# Patient Record
Sex: Male | Born: 1970 | Race: Black or African American | Hispanic: No | Marital: Married | Smoking: Current every day smoker
Health system: Southern US, Community
[De-identification: ages and names within clinical notes are randomized; demographics above are authoritative.]

## PROBLEM LIST (undated history)

## (undated) DIAGNOSIS — D869 Sarcoidosis, unspecified: Secondary | ICD-10-CM

---

## 2011-11-02 ENCOUNTER — Emergency Department (HOSPITAL_COMMUNITY)
Admission: EM | Admit: 2011-11-02 | Discharge: 2011-11-02 | Disposition: A | Discharge status: Home / Self Care | Payer: No Typology Code available for payment source | Attending: Emergency Medicine | Admitting: Emergency Medicine

## 2011-11-02 ENCOUNTER — Encounter: Payer: Self-pay | Admitting: Emergency Medicine

## 2011-11-02 DIAGNOSIS — M545 Low back pain, unspecified: Secondary | ICD-10-CM | POA: Insufficient documentation

## 2011-11-02 DIAGNOSIS — Y92410 Unspecified street and highway as the place of occurrence of the external cause: Secondary | ICD-10-CM | POA: Insufficient documentation

## 2011-11-02 DIAGNOSIS — T148XXA Other injury of unspecified body region, initial encounter: Secondary | ICD-10-CM | POA: Insufficient documentation

## 2011-11-02 MED ORDER — IBUPROFEN 800 MG PO TABS: 800.0000 mg | ORAL_TABLET | Freq: Three times a day (TID) | ORAL | Status: AC | PRN

## 2011-11-02 MED ORDER — METHOCARBAMOL 500 MG PO TABS: 1000.0000 mg | ORAL_TABLET | Freq: Four times a day (QID) | ORAL | Status: AC
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
## 2011-11-02 NOTE — ED Notes (Signed)
Left lower back pain and neck pain. 5/10. No loss of bowek or bladder function.  Movement makes pain worse. Not taking anything for pain
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
## 2011-11-03 NOTE — ED Provider Notes (Signed)
Medical screening examination/treatment/procedure(s) were performed by non-physician practitioner and as supervising physician I was immediately available for consultation/collaboration.  Ellenor Wisniewski, MD 11/03/11 0039 

## 2012-05-09 ENCOUNTER — Emergency Department (HOSPITAL_COMMUNITY): Payer: Self-pay

## 2012-05-09 ENCOUNTER — Emergency Department (HOSPITAL_COMMUNITY)
Admission: EM | Admit: 2012-05-09 | Discharge: 2012-05-09 | Disposition: A | Discharge status: Home / Self Care | Payer: Self-pay | Attending: Emergency Medicine | Admitting: Emergency Medicine

## 2012-05-09 ENCOUNTER — Encounter (HOSPITAL_COMMUNITY): Payer: Self-pay

## 2012-05-09 DIAGNOSIS — R109 Unspecified abdominal pain: Secondary | ICD-10-CM | POA: Insufficient documentation

## 2012-05-09 DIAGNOSIS — R10819 Abdominal tenderness, unspecified site: Secondary | ICD-10-CM | POA: Insufficient documentation

## 2012-05-09 DIAGNOSIS — R112 Nausea with vomiting, unspecified: Secondary | ICD-10-CM | POA: Insufficient documentation

## 2012-05-09 LAB — DIFFERENTIAL
Basophils Relative: 1 % (ref 0–1)
Eosinophils Absolute: 0.2 10*3/uL (ref 0.0–0.7)
Eosinophils Relative: 3 % (ref 0–5)
Lymphs Abs: 1.5 10*3/uL (ref 0.7–4.0)
Monocytes Absolute: 0.7 10*3/uL (ref 0.1–1.0)
Monocytes Relative: 8 % (ref 3–12)
Neutrophils Relative %: 71 % (ref 43–77)

## 2012-05-09 LAB — COMPREHENSIVE METABOLIC PANEL
Albumin: 3 g/dL — ABNORMAL LOW (ref 3.5–5.2)
Alkaline Phosphatase: 58 U/L (ref 39–117)
BUN: 6 mg/dL (ref 6–23)
Calcium: 8.3 mg/dL — ABNORMAL LOW (ref 8.4–10.5)
Creatinine, Ser: 0.75 mg/dL (ref 0.50–1.35)
GFR calc Af Amer: >90 mL/min (ref >90)
Glucose, Bld: 93 mg/dL (ref 70–99)
Potassium: 3.5 mEq/L (ref 3.5–5.1)
Total Protein: 5.4 g/dL — ABNORMAL LOW (ref 6.0–8.3)

## 2012-05-09 LAB — URINALYSIS, ROUTINE W REFLEX MICROSCOPIC
Bilirubin Urine: NEGATIVE
Leukocytes, UA: NEGATIVE
Nitrite: NEGATIVE
Specific Gravity, Urine: 1.022 (ref 1.005–1.030)
Urobilinogen, UA: 1 mg/dL (ref 0.0–1.0)
pH: 6.5 (ref 5.0–8.0)

## 2012-05-09 LAB — CBC
MCH: 33 pg (ref 26.0–34.0)
MCHC: 33.8 g/dL (ref 30.0–36.0)
WBC: 8.4 10*3/uL (ref 4.0–10.5)

## 2012-05-09 LAB — LIPASE, BLOOD: Lipase: 37 U/L (ref 11–59)

## 2012-05-09 MED ORDER — ONDANSETRON HCL 4 MG PO TABS: 4.0000 mg | ORAL_TABLET | Freq: Four times a day (QID) | ORAL | Status: AC

## 2012-05-09 MED ORDER — MORPHINE SULFATE 4 MG/ML IJ SOLN
4.0000 mg | Freq: Once | INTRAMUSCULAR | Status: AC
  Administered 2012-05-09: 4 mg via INTRAVENOUS
  Filled 2012-05-09: qty 1

## 2012-05-09 MED ORDER — DICYCLOMINE HCL 20 MG PO TABS: 20.0000 mg | ORAL_TABLET | Freq: Two times a day (BID) | ORAL | Status: DC

## 2012-05-09 MED ORDER — SODIUM CHLORIDE 0.9 % IV BOLUS (SEPSIS)
1000.0000 mL | Freq: Once | INTRAVENOUS | Status: AC
  Administered 2012-05-09: 1000 mL via INTRAVENOUS

## 2012-05-09 MED ORDER — ONDANSETRON HCL 4 MG/2ML IJ SOLN
4.0000 mg | Freq: Once | INTRAMUSCULAR | Status: AC
  Administered 2012-05-09: 4 mg via INTRAVENOUS
  Filled 2012-05-09: qty 2

## 2012-05-09 NOTE — ED Provider Notes (Signed)
History     CSN: [id number]  Arrival date & time 05/09/12  1140   First MD Initiated Contact with Patient 05/09/12 1214      Chief Complaint  Patient presents with  . Abdominal Pain    (Consider location/radiation/quality/duration/timing/severity/associated sxs/prior treatment) HPI History from patient. 41 year old male who presents with 3-4 days of crampy abdominal pain. States that he experiences a crampy sensation to the left side of his abdomen. It has not significantly changed in character since it began. It does not radiate. No known aggravating/alleviating factors. He did have one episode of emesis last evening. No change in bowel movements. He denies fever or chills. Normal appetite. Has taken Tylenol which was somewhat helpful. No history of abdominal surgery.  History reviewed. No pertinent past medical history.  History reviewed. No pertinent past surgical history.  History reviewed. No pertinent family history.  History  Substance Use Topics  . Smoking status: Current Everyday Smoker  . Smokeless tobacco: Never Used  . Alcohol Use: Yes      Review of Systems  Constitutional: Negative for fever, chills, activity change and appetite change.  Respiratory: Negative for cough, chest tightness and shortness of breath.   Cardiovascular: Negative for chest pain and palpitations.  Gastrointestinal: Positive for nausea, vomiting and abdominal pain. Negative for diarrhea, constipation, blood in stool, abdominal distention, anal bleeding and rectal pain.  Neurological: Negative for dizziness and weakness.  All other systems reviewed and are negative.    Allergies  Review of patient's allergies indicates no known allergies.  Home Medications  No current outpatient prescriptions on file.  BP 109/71  Pulse 68  Temp(Src) 97.6 F (36.4 C) (Oral)  Resp 18  SpO2 100%  Physical Exam  Nursing note and vitals reviewed. Constitutional: He appears well-developed and  well-nourished. No distress.  HENT:  Head: Normocephalic and atraumatic.  Eyes: EOM are normal.  Neck: Normal range of motion.  Cardiovascular: Normal rate, regular rhythm and normal heart sounds.   Pulmonary/Chest: Effort normal and breath sounds normal.  Abdominal: Soft. Bowel sounds are normal.       L abd mildly tender to palpation, no rebound or guard. No epigastric or R sided pain.  Musculoskeletal: Normal range of motion.  Neurological: He is alert.  Skin: Skin is warm and dry. He is not diaphoretic.  Psychiatric: He has a normal mood and affect.    ED Course  Procedures (including critical care time)  Labs Reviewed  CBC - Abnormal; Notable for the following:    RBC 3.97 (*)    HCT 38.8 (*)    All other components within normal limits  COMPREHENSIVE METABOLIC PANEL - Abnormal; Notable for the following:    Calcium 8.3 (*)    Total Protein 5.4 (*)    Albumin 3.0 (*)    All other components within normal limits  DIFFERENTIAL  LIPASE, BLOOD  URINALYSIS, ROUTINE W REFLEX MICROSCOPIC   Dg Abd Acute W/chest  05/09/2012  *RADIOLOGY REPORT*  Clinical Data: Left side abdominal pain for 4 days, constipation, vomiting, history sarcoidosis, smoking  ACUTE ABDOMEN SERIES (ABDOMEN 2 VIEW & CHEST 1 VIEW)  Comparison: None  Findings: Normal heart size, mediastinal contours, and pulmonary vascularity. Calcified bilateral hilar and minimal mediastinal adenopathy consistent with history of sarcoidosis. Bibasilar atelectasis versus scarring greater on the right. No acute infiltrate, pleural effusion or pneumothorax. Normal bowel gas pattern. No bowel dilatation, bowel wall thickening, or free intraperitoneal air. No urinary tract calcification or acute abdominal findings.  IMPRESSION: Calcified granulomatous disease consistent with history of sarcoidosis. Bibasilar atelectasis versus scarring, greater on the right. No acute abdominal findings.  Original Report Authenticated By: Lollie Marrow,  M.D.     1. Abdominal pain       MDM  Pt presents with 3-4 days of crampy abd pain, without known aggravating/alleviating factors. Very mildly tender to L side of abd. Afebrile with nl vitals. Labs, acute abd series unremarkable. Pt feeling better with medication here, able to eat food while here without worsening pain/n/v. Based on this, will tx symptomatically. Discussed findings and plan with pt, as well as reasons to return to ED. Discussed serial abd exams for worsening pain. Pt and wife verbalized understanding, were agreeable with plan.        Grant Fontana, [place] 05/09/12 [phone]

## 2012-05-09 NOTE — Discharge Instructions (Signed)
Your labs were normal appearing today and did not show signs of problems with your electrolytes, infection, or other worrisome findings. You have been given a prescription for a medication to help with the crampiness as well as a medication to help with the nausea. Please take these as prescribed. Also consider adding a stool softener such as Senokot or similar, which is over the counter. If your condition worsens, you develop high fever, worsening vomiting, or any other worrisome symptoms, please return to the ED immediately.  RESOURCE GUIDE  Dental Problems  Patients with Medicaid: [facility] [phone]                                           [id number] Phone:  [phone]                                                  Phone:  [phone]  If unable to pay or uninsured, contact:  [facility] or [facility]. to become qualified for the adult dental clinic.  Chronic Pain Problems Contact [facility]  [phone] Patients need to be referred by their primary care doctor.  Insufficient Money for Medicine Contact [employer]:  call "211" or [facility] [phone].  No Primary Care Doctor Call [facility]  [phone] Other agencies that provide inexpensive medical care    [facility]  [phone]    [facility]  [phone]    [facility]  [phone]    [facility]  [phone]    [facility]  [phone]    [facility]  [phone]  Psychological Services [facility]  [phone] [facility]  [phone] [facility]   [phone] (emergency services ([phone])  Substance Abuse Resources [facility]  [phone] [facility] (386) [phone] [facility] [phone] Floydene Flock [phone] Residential & Outpatient Substance Abuse Program   [phone]  Abuse/Neglect [facility] Child Abuse Hotline (609)093-1295 [facility] Hotline (226) 634-8247 (After Hours)  Emergency Shelter [facility] [phone]  M[facility] [place] [facility] (989)003-7906 [facility] (737)[phone]  MRSA Hotline #:   747-556-5001    [facility] Resources  [facility] [place]     [employer]                          [facility] [address] [place]                       [address]      [address]  Patrecia Pace  Michell Heinrich Phone:  [phone]                                   Phone:  [phone]                 Phone:  [phone]  [facility] Phone:  [phone]  [facility] Child Abuse Hotline (234)209-6108 224-371-7836 (After Hours)  Abdominal Pain (Nonspecific) Your exam might not show the exact reason you have abdominal pain. Since there are many different causes of abdominal pain, another checkup and more tests may be needed. It is very important to follow up for lasting (persistent) or worsening symptoms. A possible cause of abdominal pain in any person who still has his or her appendix is acute appendicitis. Appendicitis is often hard to diagnose. Normal blood tests, urine tests, ultrasound, and CT scans do not completely rule out early appendicitis or other causes of abdominal pain. Sometimes, only the changes that happen over time will allow appendicitis and other causes of abdominal pain to be determined. Other potential problems that may require surgery may also take time to become more apparent. Because of this, it is important that you follow all of the instructions below. HOME CARE INSTRUCTIONS   Rest as much as possible.   Do not eat solid food until your pain is gone.   While adults or children have pain: A  diet of water, weak decaffeinated tea, broth or bouillon, gelatin, oral rehydration solutions (ORS), frozen ice pops, or ice chips may be helpful.   When pain is gone in adults or children: Start a light diet (dry toast, crackers, applesauce, or white rice). Increase the diet slowly as long as it does not bother you. Eat no dairy products (including cheese and eggs) and no spicy, fatty, fried, or high-fiber foods.   Use no alcohol, caffeine, or cigarettes.   Take your regular medicines unless your caregiver told you not to.   Take any prescribed medicine as directed.   Only take over-the-counter or prescription medicines for pain, discomfort, or fever as directed by your caregiver. Do not give aspirin to children.  If your caregiver has given you a follow-up appointment, it is very important to keep that appointment. Not keeping the appointment could result in a permanent injury and/or lasting (chronic) pain and/or disability. If there is any problem keeping the appointment, you must call to reschedule.  SEEK IMMEDIATE MEDICAL CARE IF:   Your pain is not gone in 24 hours.   Your pain becomes worse, changes location, or feels different.   You or your child has an oral temperature above 102 F (38.9 C), not controlled by medicine.   Your baby is older than 3 months with a rectal temperature of 102 F (38.9 C) or higher.   Your baby is 71 months old or younger with a rectal temperature of 100.4 F (38 C) or higher.   You have shaking chills.   You keep throwing up (vomiting) or cannot drink liquids.   There is blood in your vomit or you see blood in your bowel movements.   Your bowel movements become dark or black.   You have frequent bowel movements.   Your bowel movements stop (become blocked) or you cannot pass gas.   You have bloody, frequent, or painful urination.   You have yellow discoloration in the skin or whites of the eyes.  Your stomach becomes bloated or bigger.    You have dizziness or fainting.   You have chest or back pain.  MAKE SURE YOU:   Understand these instructions.   Will watch your condition.   Will get help right away if you are not doing well or get worse.  Document Released: 12/10/2005 Document Revised: 11/29/2011 Document Reviewed: 11/07/2009 [facility] Patient Information 2012 [place], [place].
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
## 2012-05-10 NOTE — ED Provider Notes (Signed)
Medical screening examination/treatment/procedure(s) were performed by non-physician practitioner and as supervising physician I was immediately available for consultation/collaboration.   Celene Kras, MD 05/10/12 [phone]

## 2012-06-18 ENCOUNTER — Emergency Department (HOSPITAL_COMMUNITY)
Admission: EM | Admit: 2012-06-18 | Discharge: 2012-06-18 | Disposition: A | Discharge status: Home / Self Care | Payer: Self-pay | Attending: Emergency Medicine | Admitting: Emergency Medicine

## 2012-06-18 ENCOUNTER — Encounter (HOSPITAL_COMMUNITY): Payer: Self-pay | Admitting: Family Medicine

## 2012-06-18 DIAGNOSIS — IMO0001 Reserved for inherently not codable concepts without codable children: Secondary | ICD-10-CM | POA: Insufficient documentation

## 2012-06-18 DIAGNOSIS — F17200 Nicotine dependence, unspecified, uncomplicated: Secondary | ICD-10-CM | POA: Insufficient documentation

## 2012-06-18 DIAGNOSIS — W57XXXA Bitten or stung by nonvenomous insect and other nonvenomous arthropods, initial encounter: Secondary | ICD-10-CM

## 2012-06-18 DIAGNOSIS — S90569A Insect bite (nonvenomous), unspecified ankle, initial encounter: Secondary | ICD-10-CM | POA: Insufficient documentation

## 2012-06-18 HISTORY — DX: Sarcoidosis, unspecified: D86.9

## 2012-06-18 MED ORDER — DIPHENHYDRAMINE HCL 25 MG PO TABS: 25.0000 mg | ORAL_TABLET | Freq: Four times a day (QID) | ORAL | Status: DC

## 2012-06-18 NOTE — Discharge Instructions (Signed)
Insect Bite Mosquitoes, flies, fleas, bedbugs, and many other insects can bite. Insect bites are different from insect stings. A sting is when venom is injected into the skin. Some insect bites can transmit infectious diseases. SYMPTOMS  Insect bites usually turn red, swell, and itch for 2 to 4 days. They often go away on their own. TREATMENT  Your caregiver may prescribe antibiotic medicines if a bacterial infection develops in the bite. HOME CARE INSTRUCTIONS  Do not scratch the bite area.   Keep the bite area clean and dry. Wash the bite area thoroughly with soap and water.   Put ice or cool compresses on the bite area.   Put ice in a plastic bag.   Place a towel between your skin and the bag.   Leave the ice on for 20 minutes, 4 times a day for the first 2 to 3 days, or as directed.   You may apply a baking soda paste, cortisone cream, or calamine lotion to the bite area as directed by your caregiver. This can help reduce itching and swelling.   Only take over-the-counter or prescription medicines as directed by your caregiver.   If you are given antibiotics, take them as directed. Finish them even if you start to feel better.  You may need a tetanus shot if:  You cannot remember when you had your last tetanus shot.   You have never had a tetanus shot.   The injury broke your skin.  If you get a tetanus shot, your arm may swell, get red, and feel warm to the touch. This is common and not a problem. If you need a tetanus shot and you choose not to have one, there is a rare chance of getting tetanus. Sickness from tetanus can be serious. SEEK IMMEDIATE MEDICAL CARE IF:   You have increased pain, redness, or swelling in the bite area.   You see a red line on the skin coming from the bite.   You have a fever.   You have joint pain.   You have a headache or neck pain.   You have unusual weakness.   You have a rash.   You have chest pain or shortness of breath.   You  have abdominal pain, nausea, or vomiting.   You feel unusually tired or sleepy.  MAKE SURE YOU:   Understand these instructions.   Will watch your condition.   Will get help right away if you are not doing well or get worse.  Document Released: 01/17/2005 Document Revised: 11/29/2011 Document Reviewed: 07/11/2011 [employer] Patient Information 2012 [employer]. 

## 2012-06-18 NOTE — ED Notes (Signed)
Pt complaining of multiple bug bites on RUE and RLE.

## 2012-06-18 NOTE — ED Provider Notes (Signed)
History    This chart was scribed for Luke Shi, MD, MD by Smitty Pluck. The patient was seen in room TR11C and the patient's care was started at 2:24PM.   CSN: [id number]  Arrival date & time 06/18/12  1327   None     Chief Complaint  Patient presents with  . Rash    (Consider location/radiation/quality/duration/timing/severity/associated sxs/prior treatment) Patient is a 41 y.o. male presenting with rash. The history is provided by the patient.  Rash    Luke Mullins is a 41 y.o. male who presents to the Emergency Department complaining of bug bites on right arm and right leg onset 2 days ago. Pt reports that he was in his garage and felt mosquito biting him on his arm and leg. Pt reports having swelling in the areas and feeling bumps. There is no radiation. Symptoms have been constant since onset. Denies any other pain.   Past Medical History  Diagnosis Date  . Sarcoidosis     History reviewed. No pertinent past surgical history.  History reviewed. No pertinent family history.  History  Substance Use Topics  . Smoking status: Current Everyday Smoker  . Smokeless tobacco: Never Used  . Alcohol Use: Yes      Review of Systems  Skin: Positive for rash.  All other systems reviewed and are negative.  10 Systems reviewed and all are negative for acute change except as noted in the HPI.   Allergies  Review of patient's allergies indicates no known allergies.  Home Medications   Current Outpatient Rx  Name Route Sig Dispense Refill  . DIPHENHYDRAMINE HCL 25 MG PO TABS Oral Take 1 tablet (25 mg total) by mouth every 6 (six) hours. 20 tablet 0    BP 121/78  Pulse 86  Temp 98.3 F (36.8 C) (Oral)  Resp 15  SpO2 97%  Physical Exam  Nursing note and vitals reviewed. Constitutional: He is oriented to person, place, and time. He appears well-developed and well-nourished. No distress.  HENT:  Head: Normocephalic and atraumatic.  Eyes: Pupils are equal,  round, and reactive to light.  Neck: Normal range of motion.  Cardiovascular: Normal rate and intact distal pulses.   Pulmonary/Chest: No respiratory distress.  Abdominal: Normal appearance. He exhibits no distension.  Musculoskeletal: Normal range of motion.  Neurological: He is alert and oriented to person, place, and time. No cranial nerve deficit.  Skin: Skin is warm and dry. No rash noted.       A few scattered bug bites on his right arm and right calf area.  Examination reveals no cellulitis or infection.  Some localized erythema noted.  No swelling noted to the calf measurement of the calves were equal.  Good pulses distally.  Psychiatric: He has a normal mood and affect. His behavior is normal.    ED Course  Procedures (including critical care time) DIAGNOSTIC STUDIES: Oxygen Saturation is 97% on room air, normal by my interpretation.    COORDINATION OF CARE: 2:30PM EDP discusses pt ED treatment with pt.   Labs Reviewed - No data to display No results found.   1. Insect bite       MDM  I personally performed the services described in this documentation, which was scribed in my presence. The recorded information has been reviewed and considered.           Luke Shi, MD 06/18/12 [phone]

## 2012-10-10 ENCOUNTER — Emergency Department (HOSPITAL_COMMUNITY): Payer: Self-pay

## 2012-10-10 ENCOUNTER — Emergency Department (HOSPITAL_COMMUNITY)
Admission: EM | Admit: 2012-10-10 | Discharge: 2012-10-11 | Disposition: A | Discharge status: Home / Self Care | Payer: Self-pay | Attending: Emergency Medicine | Admitting: Emergency Medicine

## 2012-10-10 ENCOUNTER — Encounter (HOSPITAL_COMMUNITY): Payer: Self-pay | Admitting: Emergency Medicine

## 2012-10-10 DIAGNOSIS — F17200 Nicotine dependence, unspecified, uncomplicated: Secondary | ICD-10-CM | POA: Insufficient documentation

## 2012-10-10 DIAGNOSIS — Y9289 Other specified places as the place of occurrence of the external cause: Secondary | ICD-10-CM | POA: Insufficient documentation

## 2012-10-10 DIAGNOSIS — S63269A Dislocation of metacarpophalangeal joint of unspecified finger, initial encounter: Secondary | ICD-10-CM | POA: Insufficient documentation

## 2012-10-10 DIAGNOSIS — D869 Sarcoidosis, unspecified: Secondary | ICD-10-CM | POA: Insufficient documentation

## 2012-10-10 DIAGNOSIS — S63104A Unspecified dislocation of right thumb, initial encounter: Secondary | ICD-10-CM

## 2012-10-10 DIAGNOSIS — W010XXA Fall on same level from slipping, tripping and stumbling without subsequent striking against object, initial encounter: Secondary | ICD-10-CM | POA: Insufficient documentation

## 2012-10-10 MED ORDER — HYDROMORPHONE HCL PF 1 MG/ML IJ SOLN
1.0000 mg | Freq: Once | INTRAMUSCULAR | Status: AC
  Administered 2012-10-10: 1 mg via INTRAMUSCULAR
  Filled 2012-10-10: qty 1

## 2012-10-10 NOTE — ED Provider Notes (Signed)
History     CSN: [id number]  Arrival date & time 10/10/12  2004   First MD Initiated Contact with Patient 10/10/12 2216      Chief Complaint  Patient presents with  . Hand Pain    (Consider location/radiation/quality/duration/timing/severity/associated sxs/prior treatment) HPI History provided by pt.   Pt tripped in garage today and landed on right hand.  Has had severe pain in right thumb ever since.  Aggravated by ROM.  No associated paresthesias.    Past Medical History  Diagnosis Date  . Sarcoidosis     History reviewed. No pertinent past surgical history.  No family history on file.  History  Substance Use Topics  . Smoking status: Current Every Day Smoker  . Smokeless tobacco: Never Used  . Alcohol Use: Yes      Review of Systems  All other systems reviewed and are negative.    Allergies  Review of patient's allergies indicates no known allergies.  Home Medications   Current Outpatient Rx  Name Route Sig Dispense Refill  . DIPHENHYDRAMINE HCL 25 MG PO TABS Oral Take 1 tablet (25 mg total) by mouth every 6 (six) hours. 20 tablet 0    BP 148/101  Pulse 79  Temp 98.1 F (36.7 C) (Oral)  Resp 18  SpO2 96%  Physical Exam  Nursing note and vitals reviewed. Constitutional: He is oriented to person, place, and time. He appears well-developed and well-nourished. No distress.  HENT:  Head: Normocephalic and atraumatic.  Eyes:       Normal appearance  Neck: Normal range of motion.  Pulmonary/Chest: Effort normal.  Musculoskeletal: Normal range of motion.       Obvious deformity of right thumb at MCP joint.  Tenderness at MCP joint only.  Full active ROM of thumb w/ mild pain.  Full active ROM of wrist w/out pain.  2+ radial pulse and distal sensation intact.    Neurological: He is alert and oriented to person, place, and time.  Psychiatric: He has a normal mood and affect. His behavior is normal.    ED Course  Reduction of dislocation Date/Time:  10/11/2012 2:10 AM Performed by: Otilio Miu Authorized by: Ruby Cola E Consent: Verbal consent obtained. Risks and benefits: risks, benefits and alternatives were discussed Consent given by: patient Imaging studies: imaging studies available Local anesthesia used: no Patient sedated: no Patient tolerance: Patient tolerated the procedure well with no immediate complications. Comments: Right 1st MCP joint   (including critical care time)  Labs Reviewed - No data to display Dg Hand Complete Right  10/10/2012  *RADIOLOGY REPORT*  Clinical Data: Fall, right thumb pain  RIGHT HAND - COMPLETE 3+ VIEW  Comparison: None.  Findings: No fracture is seen.  Suspected ulnar/ventral subluxation of the first proximal phalanx at the MCP joint.  Visualized soft tissues are unremarkable.  IMPRESSION: Suspected subluxation of the first proximal phalanx at the MCP joint.   Original Report Authenticated By: Charline Bills, M.D.      1. Dislocation of right thumb       MDM  41yo M had mechanical fall, landed on outstretched right hand and presents w/ what appears to be a dislocated right thumb.  Xray neg for fx.  NV intact.  Pt received 1mg  IM dilaudid for pain.  Attempted manual reduction but no change in appearance of thumb and discomfort did not improve.  Dr. Patria Mane attempted as well and then recommended repeat xray which showed unchanged or mildly improved alignment of  MCP joint.  Ortho tech placed in velcrow thumb spica and pt d/c'd home w/ referral to Hand.  I recommended NSAID and RICE.         Otilio Miu, [place] 10/11/12 [phone]

## 2012-10-10 NOTE — ED Notes (Signed)
Patient transported to X-ray 

## 2012-10-10 NOTE — ED Notes (Signed)
MD at bedside. 

## 2012-10-10 NOTE — ED Notes (Signed)
Pt fell on R hand in garage this morning.  C/o pain and swelling to R thumb.

## 2012-10-11 ENCOUNTER — Emergency Department (HOSPITAL_COMMUNITY): Payer: Self-pay

## 2012-10-11 NOTE — Progress Notes (Signed)
Orthopedic Tech Progress Note Patient Details:  Luke Mullins 1971/01/02 [id number]  Ortho Devices Type of Ortho Device: Thumb velcro splint   Haskell Flirt 10/11/2012, 12:39 AM

## 2012-10-11 NOTE — ED Provider Notes (Signed)
Medical screening examination/treatment/procedure(s) were conducted as a shared visit with non-physician practitioner(s) and myself.  I personally evaluated the patient during the encounter  Easily reduced. Still with laxity, suspect tendon/ligament issue. Will have follow up with hand surgery. NVI  Lyanne Co, MD 10/11/12 [phone]

## 2013-09-15 ENCOUNTER — Encounter (HOSPITAL_COMMUNITY): Payer: Self-pay | Admitting: *Deleted

## 2013-09-15 ENCOUNTER — Emergency Department (HOSPITAL_COMMUNITY)
Admission: EM | Admit: 2013-09-15 | Discharge: 2013-09-15 | Disposition: A | Discharge status: Home / Self Care | Payer: No Typology Code available for payment source | Attending: Emergency Medicine | Admitting: Emergency Medicine

## 2013-09-15 DIAGNOSIS — R0602 Shortness of breath: Secondary | ICD-10-CM | POA: Insufficient documentation

## 2013-09-15 DIAGNOSIS — R0989 Other specified symptoms and signs involving the circulatory and respiratory systems: Secondary | ICD-10-CM | POA: Insufficient documentation

## 2013-09-15 DIAGNOSIS — Y939 Activity, unspecified: Secondary | ICD-10-CM | POA: Insufficient documentation

## 2013-09-15 DIAGNOSIS — Y929 Unspecified place or not applicable: Secondary | ICD-10-CM | POA: Insufficient documentation

## 2013-09-15 DIAGNOSIS — T6391XA Toxic effect of contact with unspecified venomous animal, accidental (unintentional), initial encounter: Secondary | ICD-10-CM | POA: Insufficient documentation

## 2013-09-15 DIAGNOSIS — F17200 Nicotine dependence, unspecified, uncomplicated: Secondary | ICD-10-CM | POA: Insufficient documentation

## 2013-09-15 DIAGNOSIS — T7840XA Allergy, unspecified, initial encounter: Secondary | ICD-10-CM

## 2013-09-15 DIAGNOSIS — T63461A Toxic effect of venom of wasps, accidental (unintentional), initial encounter: Secondary | ICD-10-CM | POA: Insufficient documentation

## 2013-09-15 DIAGNOSIS — R0609 Other forms of dyspnea: Secondary | ICD-10-CM | POA: Insufficient documentation

## 2013-09-15 DIAGNOSIS — Z8619 Personal history of other infectious and parasitic diseases: Secondary | ICD-10-CM | POA: Insufficient documentation

## 2013-09-15 MED ORDER — EPINEPHRINE 0.3 MG/0.3ML IJ SOAJ
0.3000 mg | Freq: Once | INTRAMUSCULAR | Status: AC
  Administered 2013-09-15: 0.3 mg via INTRAMUSCULAR
  Filled 2013-09-15: qty 0.3

## 2013-09-15 MED ORDER — FAMOTIDINE IN NACL 20-0.9 MG/50ML-% IV SOLN
20.0000 mg | Freq: Once | INTRAVENOUS | Status: AC
  Administered 2013-09-15: 20 mg via INTRAVENOUS
  Filled 2013-09-15: qty 50

## 2013-09-15 MED ORDER — SODIUM CHLORIDE 0.9 % IV BOLUS (SEPSIS)
1000.0000 mL | Freq: Once | INTRAVENOUS | Status: AC
  Administered 2013-09-15: 1000 mL via INTRAVENOUS

## 2013-09-15 MED ORDER — RANITIDINE HCL 150 MG PO CAPS: 150.0000 mg | ORAL_CAPSULE | Freq: Every day | ORAL | Status: DC

## 2013-09-15 MED ORDER — EPINEPHRINE 0.3 MG/0.3ML IJ SOAJ: 0.3000 mg | INTRAMUSCULAR | Status: AC | PRN

## 2013-09-15 MED ORDER — METHYLPREDNISOLONE SODIUM SUCC 125 MG IJ SOLR
125.0000 mg | Freq: Once | INTRAMUSCULAR | Status: AC
  Administered 2013-09-15: 125 mg via INTRAVENOUS
  Filled 2013-09-15: qty 2

## 2013-09-15 MED ORDER — DIPHENHYDRAMINE HCL 50 MG/ML IJ SOLN
25.0000 mg | Freq: Once | INTRAMUSCULAR | Status: AC
  Administered 2013-09-15: 25 mg via INTRAVENOUS
  Filled 2013-09-15: qty 1

## 2013-09-15 MED ORDER — PREDNISONE 20 MG PO TABS: ORAL_TABLET | ORAL | Status: DC

## 2013-09-15 NOTE — ED Notes (Signed)
Pt is having reaction to bee sting to neck 45 minutes ago.  Throat tightening, sob, hives, and patient is anxious

## 2013-09-15 NOTE — ED Provider Notes (Signed)
CSN: [id number]     Arrival date & time 09/15/13  1648 History   First MD Initiated Contact with Patient 09/15/13 1654     Chief Complaint  Patient presents with  . Bee sting/SOB    (Consider location/radiation/quality/duration/timing/severity/associated sxs/prior Treatment) Patient is a 42 y.o. male presenting with allergic reaction. The history is provided by the patient. No language interpreter was used.  Allergic Reaction Presenting symptoms: difficulty swallowing and rash   Difficulty swallowing:    Severity:  Moderate   Onset quality:  Sudden   Duration: 30 mins.   Timing:  Constant   Progression:  Unchanged Rash:    Location:  Full body   Quality: itchiness     Severity:  Moderate   Onset quality:  Sudden   Duration: 30 mins.   Timing:  Constant   Progression:  Unchanged Severity:  Moderate Prior allergic episodes:  Insect allergies Context: insect bite/sting   Relieved by:  Nothing Worsened by:  Nothing tried Ineffective treatments:  None tried   Past Medical History  Diagnosis Date  . Sarcoidosis    History reviewed. No pertinent past surgical history. No family history on file. History  Substance Use Topics  . Smoking status: Current Every Day Smoker  . Smokeless tobacco: Never Used  . Alcohol Use: Yes    Review of Systems  Constitutional: Negative for fever.  HENT: Positive for trouble swallowing. Negative for congestion, sore throat and rhinorrhea.   Respiratory: Positive for shortness of breath. Negative for cough.   Cardiovascular: Negative for chest pain.  Gastrointestinal: Negative for nausea, vomiting, abdominal pain and diarrhea.  Genitourinary: Negative for dysuria and hematuria.  Skin: Positive for rash.  Neurological: Negative for syncope, light-headedness and headaches.  All other systems reviewed and are negative.    Allergies  Other  Home Medications  No current outpatient prescriptions on file. BP 111/70  Pulse 124  Temp(Src)  98.6 F (37 C) (Oral)  Resp 22  SpO2 94% Physical Exam  Nursing note and vitals reviewed. Constitutional: He is oriented to person, place, and time. He appears well-developed and well-nourished.  HENT:  Head: Normocephalic and atraumatic.  Right Ear: External ear normal.  Left Ear: External ear normal.  Eyes: EOM are normal. Pupils are equal, round, and reactive to light.  Neck: Normal range of motion. Neck supple.  Cardiovascular: Normal rate, regular rhythm, normal heart sounds and intact distal pulses.  Exam reveals no gallop and no friction rub.   No murmur heard. Pulmonary/Chest: Effort normal. No respiratory distress. He has wheezes (occ). He has no rales. He exhibits no tenderness.  Abdominal: Soft. Bowel sounds are normal. He exhibits no distension. There is no tenderness. There is no rebound.  Musculoskeletal: Normal range of motion. He exhibits no edema and no tenderness.  Lymphadenopathy:    He has no cervical adenopathy.  Neurological: He is alert and oriented to person, place, and time.  Skin: Skin is warm. Rash (urticaria) noted.  Psychiatric: He has a normal mood and affect. His behavior is normal.    ED Course  Procedures (including critical care time) Labs Review Labs Reviewed - No data to display Imaging Review No results found.  MDM   1. Allergic reaction, initial encounter    5:11 PM Pt is a 42 y.o. male with pertinent PMHX of sarcoidosis who presents to the ED with allergic reaction. Pt with known exposure to bee sting around 4:30PM. Pt endorses rash, throat closing, shortness of breath, wheezing,no nausea, no  vomiting, no diarrhea. Rash noted on entire body.No epi-pen administered. 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 No other recent illness.  On exam AF, tachycardic, tachypnic. Urticarial rash on all extremities and on trunk.  Concerning Signs of : wheezing, urticaria, subjective throat closing  Plan for: IM Epi, Solumedrol, Zantac and Benadryl and  fluids. Will monitor for hours and discharge home with script for epi-pen, 2 week taper of steroids, zantac and benadryl around the clock  EKG personally reviewed by myself showed sinus tachycardia Rate of 129, PR , QRS 80ms QT/QTC 298/429ms, normal axis, without evidence of new ischemia. No Comparison showed, indication: tachycardia  On re-eval at 6:30 PM pt breathing much better and rash resolved, will monitor for at least 4 hours.  On re-eval at 7:30 PM pt feels ready to go home, offered further observation, however pt would like to go home. Will discharge pt with epi pen, steroid taper and Zantac. Strict return precautions given  7:50 PM:  I have discussed the diagnosis/risks/treatment options with the patient and family and believe the pt to be eligible for discharge home to follow-up with PCP in 1 week. We also discussed returning to the ED immediately if new or worsening sx occur. We discussed the sx which are most concerning (e.g., worsening symptoms or use of epi-pen) that necessitate immediate return. Any new prescriptions provided to the patient are listed below.   New Prescriptions   EPINEPHRINE (EPIPEN) 0.3 MG/0.3 ML SOAJ INJECTION    Inject 0.3 mLs (0.3 mg total) into the muscle as needed.   PREDNISONE (DELTASONE) 20 MG TABLET    3 tabs po daily x 3 days, then 2 tabs x 3 days, then 1.5 tabs x 3 days, then 1 tab x 3 days, then 0.5 tabs x 3 days   RANITIDINE (ZANTAC) 150 MG CAPSULE    Take 1 capsule (150 mg total) by mouth daily.    The patient appears reasonably screened and/or stabilized for discharge and I doubt any other medical condition or other [facility] requiring further screening, evaluation or treatment in the ED at this time prior to discharge . Pt in agreement with discharge plan. Return precautions given. Pt discharged [facility]  Pt was discussed with my attending, Dr. Bjorn Loser, MD 09/15/13 [phone]

## 2013-09-16 NOTE — ED Provider Notes (Signed)
I performed a history and physical examination of  Luke Mullins and discussed his management with Dr. Modesto Charon. I agree with the history, physical, assessment, and plan of care, with the following exceptions: None I was present for the following procedures: None  Time Spent in Critical Care of the patient: None  Time spent in discussions with the patient and family: 5 minutes  Luke Mullins  S.p bee sting. Pt had some tingling and tightness in his mouth. Exam is benign. meds given, and observed in the ED - d.c as sx didn't progress, and improved.   Luke Kaplan, MD 09/16/13 [phone]

## 2013-10-23 IMAGING — CR DG ABDOMEN ACUTE W/ 1V CHEST
3 series · 3 of 3 positions shown · non-contrast
Comparison: None

CLINICAL DATA: Left side abdominal pain for 4 days, constipation,
vomiting, history sarcoidosis, smoking

ACUTE ABDOMEN SERIES (ABDOMEN 2 VIEW & CHEST 1 VIEW)

[w chest pa]
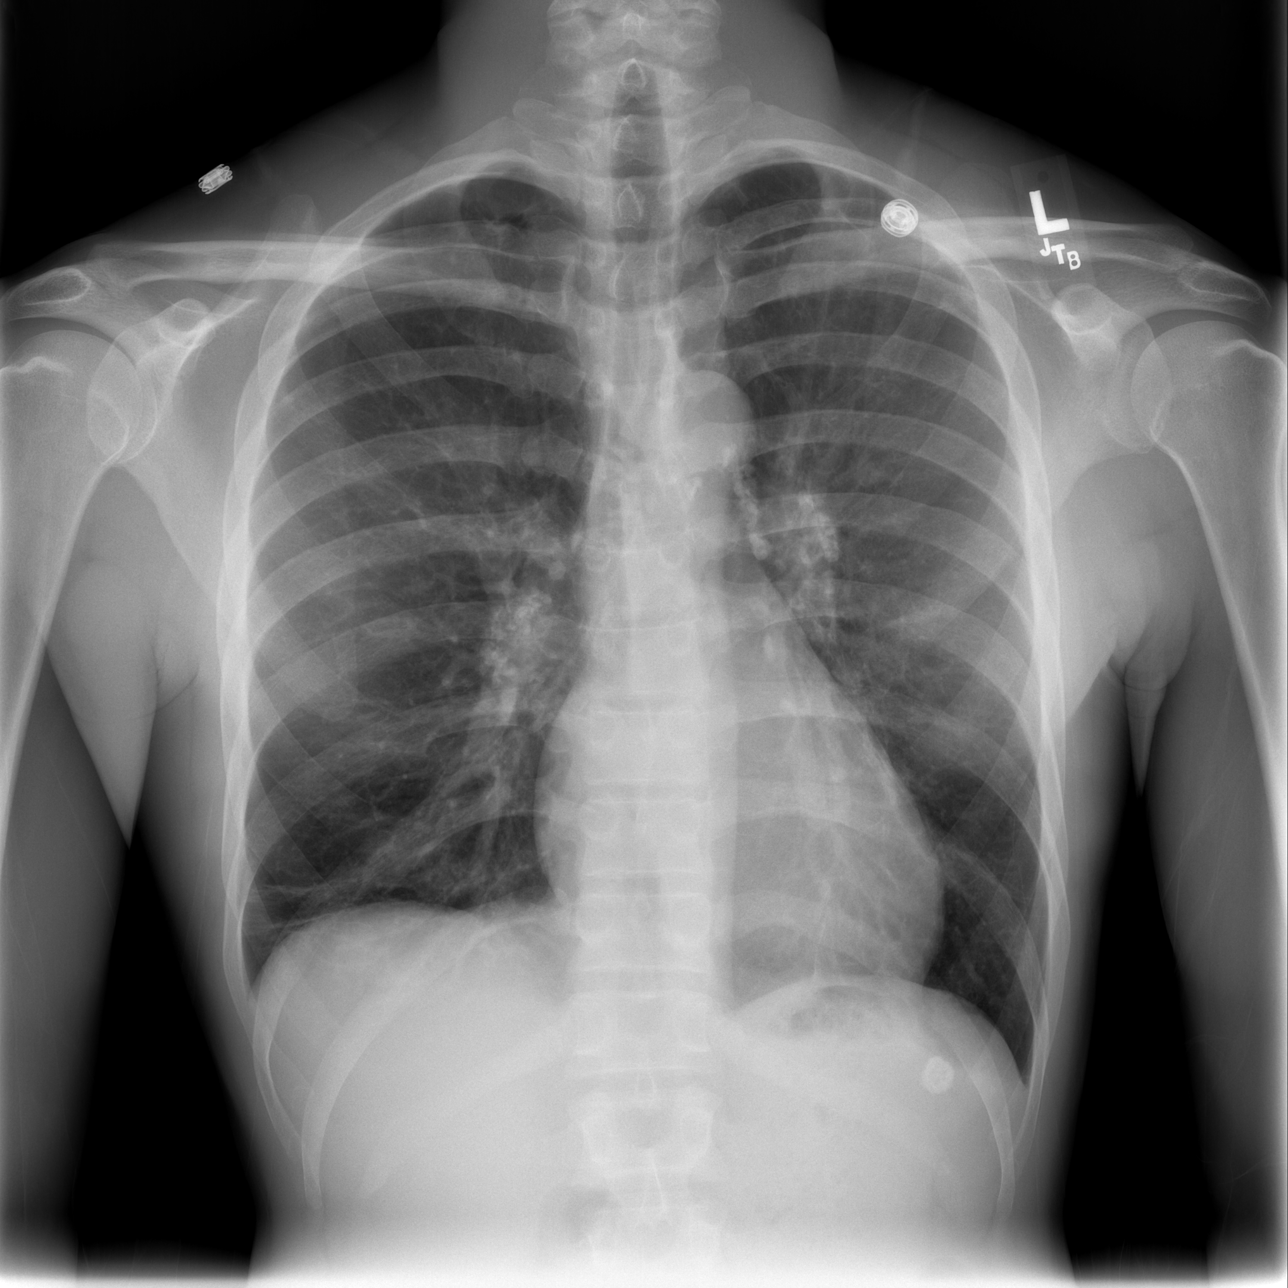

[w abdomen upright]
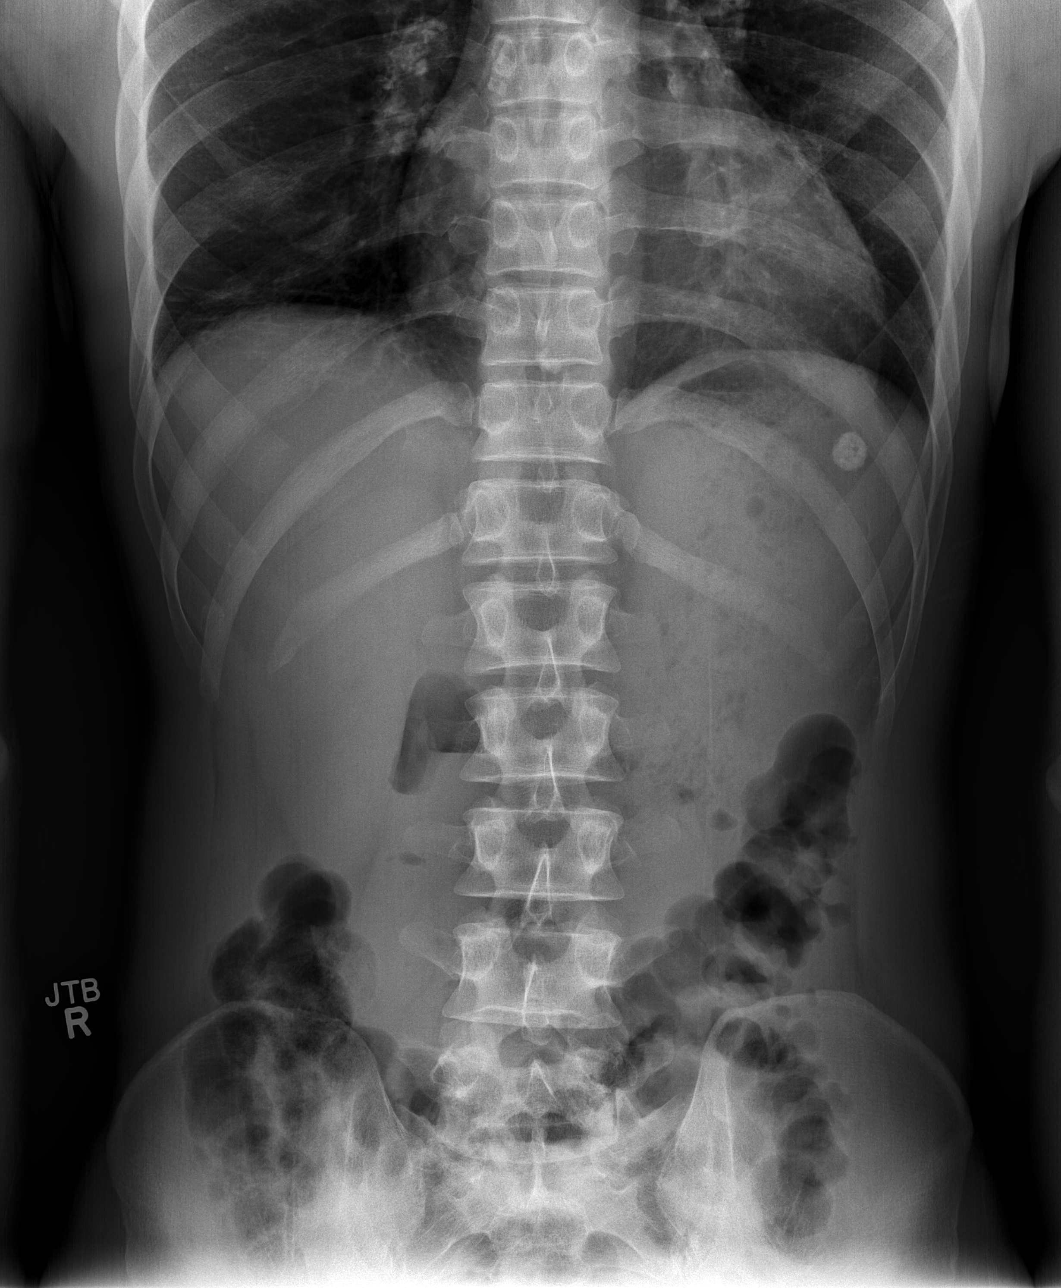

[t abdomen supine]
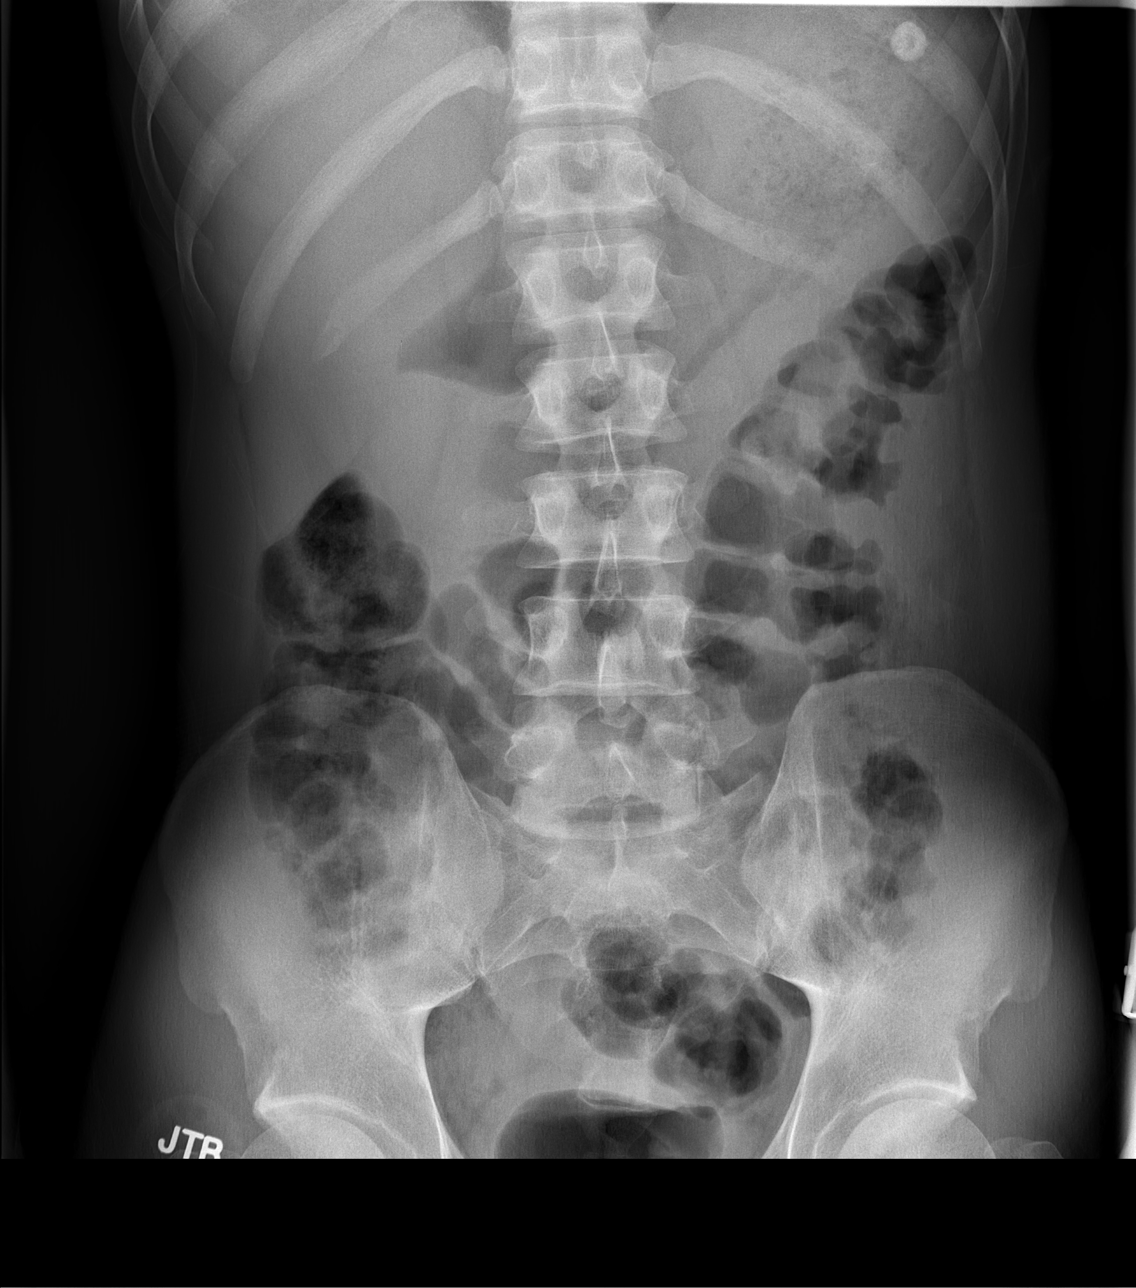

[3 of 3 positions shown; findings below may reference images not displayed]

FINDINGS: Normal heart size, mediastinal contours, and pulmonary vascularity.
Calcified bilateral hilar and minimal mediastinal adenopathy
consistent with history of sarcoidosis.
Bibasilar atelectasis versus scarring greater on the right.
No acute infiltrate, pleural effusion or pneumothorax.
Normal bowel gas pattern.
No bowel dilatation, bowel wall thickening, or free intraperitoneal
air.
No urinary tract calcification or acute abdominal findings.
IMPRESSION: Calcified granulomatous disease consistent with history of
sarcoidosis.
Bibasilar atelectasis versus scarring, greater on the right.
No acute abdominal findings.

## 2013-12-23 ENCOUNTER — Emergency Department (HOSPITAL_COMMUNITY)
Admission: EM | Admit: 2013-12-23 | Discharge: 2013-12-23 | Disposition: A | Discharge status: Home / Self Care | Payer: No Typology Code available for payment source | Attending: Emergency Medicine | Admitting: Emergency Medicine

## 2013-12-23 ENCOUNTER — Encounter (HOSPITAL_COMMUNITY): Payer: Self-pay | Admitting: Emergency Medicine

## 2013-12-23 DIAGNOSIS — R51 Headache: Secondary | ICD-10-CM | POA: Insufficient documentation

## 2013-12-23 DIAGNOSIS — IMO0001 Reserved for inherently not codable concepts without codable children: Secondary | ICD-10-CM | POA: Insufficient documentation

## 2013-12-23 DIAGNOSIS — F17200 Nicotine dependence, unspecified, uncomplicated: Secondary | ICD-10-CM | POA: Insufficient documentation

## 2013-12-23 DIAGNOSIS — M2550 Pain in unspecified joint: Secondary | ICD-10-CM | POA: Insufficient documentation

## 2013-12-23 DIAGNOSIS — R5381 Other malaise: Secondary | ICD-10-CM | POA: Insufficient documentation

## 2013-12-23 DIAGNOSIS — Z8619 Personal history of other infectious and parasitic diseases: Secondary | ICD-10-CM | POA: Insufficient documentation

## 2013-12-23 DIAGNOSIS — J069 Acute upper respiratory infection, unspecified: Secondary | ICD-10-CM

## 2013-12-23 MED ORDER — GUAIFENESIN-CODEINE 100-10 MG/5ML PO SOLN: 10.0000 mL | Freq: Four times a day (QID) | ORAL | Status: DC | PRN

## 2013-12-23 NOTE — ED Provider Notes (Signed)
CSN: [id number]     Arrival date & time 12/23/13  4098 History   First MD Initiated Contact with Patient 12/23/13 [phone]     Chief Complaint  Patient presents with  . Headache  . Cough   (Consider location/radiation/quality/duration/timing/severity/associated sxs/prior Treatment) Patient is a 42 y.o. male presenting with URI. The history is provided by the patient.  URI Presenting symptoms: congestion, cough and fatigue   Severity:  Moderate Onset quality:  Gradual Duration:  3 days Timing:  Constant Progression:  Worsening Chronicity:  New Relieved by:  Nothing Worsened by:  Nothing tried Ineffective treatments:  None tried Associated symptoms: arthralgias, headaches and myalgias   Associated symptoms: no sinus pain     Past Medical History  Diagnosis Date  . Sarcoidosis    History reviewed. No pertinent past surgical history. No family history on file. History  Substance Use Topics  . Smoking status: Current Every Day Smoker  . Smokeless tobacco: Never Used  . Alcohol Use: Yes    Review of Systems  Constitutional: Positive for fatigue.  HENT: Positive for congestion.   Respiratory: Positive for cough.   Musculoskeletal: Positive for arthralgias and myalgias.  Neurological: Positive for headaches.  All other systems reviewed and are negative.    Allergies  Bee venom  Home Medications   Current Outpatient Rx  Name  Route  Sig  Dispense  Refill  . EPINEPHrine (EPIPEN) 0.3 mg/0.3 mL SOAJ injection   Intramuscular   Inject 0.3 mLs (0.3 mg total) into the muscle as needed.   2 Device   1    BP 138/88  Pulse 76  Temp(Src) 97.5 F (36.4 C) (Oral)  Resp 16  Ht 5\' 6"  (1.676 m)  Wt 129 lb 6 oz (58.684 kg)  BMI 20.89 kg/m2  SpO2 97% Physical Exam  Nursing note and vitals reviewed. Constitutional: He is oriented to person, place, and time. He appears well-developed and well-nourished. No distress.  HENT:  Head: Normocephalic and atraumatic.   Mouth/Throat: Oropharynx is clear and moist.  Neck: Normal range of motion. Neck supple.  Cardiovascular: Normal rate, regular rhythm and normal heart sounds.   No murmur heard. Pulmonary/Chest: Effort normal and breath sounds normal. No respiratory distress. He has no wheezes.  Abdominal: Soft. Bowel sounds are normal. He exhibits no distension. There is no tenderness.  Musculoskeletal: Normal range of motion. He exhibits no edema.  Lymphadenopathy:    He has no cervical adenopathy.  Neurological: He is alert and oriented to person, place, and time.  Skin: Skin is warm and dry. He is not diaphoretic.    ED Course  Procedures (including critical care time) Labs Review Labs Reviewed - No data to display Imaging Review No results found.  EKG Interpretation   None       MDM  No diagnosis found. Patient presents with a three-day history of cough and congestion. Presentation, physical exam are consistent with a viral etiology. We'll recommend Tylenol and Motrin. Also prescribe a Robitussin with codeine as needed for cough. He is to return if he develops chest pain or difficulty breathing. There is no hypoxia, respiratory distress, and all vital signs are within normal limits.    Geoffery Lyons, MD 12/23/13 [phone]

## 2013-12-23 NOTE — ED Notes (Signed)
Pt. reports dry cough with chest congestion and headache onset Sunday this week , denies fever or chills.

## 2014-01-16 ENCOUNTER — Emergency Department (HOSPITAL_COMMUNITY)
Admission: EM | Admit: 2014-01-16 | Discharge: 2014-01-16 | Disposition: A | Discharge status: Home / Self Care | Payer: No Typology Code available for payment source | Attending: Emergency Medicine | Admitting: Emergency Medicine

## 2014-01-16 ENCOUNTER — Encounter (HOSPITAL_COMMUNITY): Payer: Self-pay | Admitting: Emergency Medicine

## 2014-01-16 DIAGNOSIS — W108XXA Fall (on) (from) other stairs and steps, initial encounter: Secondary | ICD-10-CM | POA: Insufficient documentation

## 2014-01-16 DIAGNOSIS — S7011XA Contusion of right thigh, initial encounter: Secondary | ICD-10-CM

## 2014-01-16 DIAGNOSIS — W010XXA Fall on same level from slipping, tripping and stumbling without subsequent striking against object, initial encounter: Secondary | ICD-10-CM | POA: Insufficient documentation

## 2014-01-16 DIAGNOSIS — Z8619 Personal history of other infectious and parasitic diseases: Secondary | ICD-10-CM | POA: Insufficient documentation

## 2014-01-16 DIAGNOSIS — S7010XA Contusion of unspecified thigh, initial encounter: Secondary | ICD-10-CM | POA: Insufficient documentation

## 2014-01-16 DIAGNOSIS — Y929 Unspecified place or not applicable: Secondary | ICD-10-CM | POA: Insufficient documentation

## 2014-01-16 DIAGNOSIS — Y939 Activity, unspecified: Secondary | ICD-10-CM | POA: Insufficient documentation

## 2014-01-16 DIAGNOSIS — F17200 Nicotine dependence, unspecified, uncomplicated: Secondary | ICD-10-CM | POA: Insufficient documentation

## 2014-01-16 MED ORDER — IBUPROFEN 400 MG PO TABS
800.0000 mg | ORAL_TABLET | Freq: Once | ORAL | Status: AC
  Administered 2014-01-16: 800 mg via ORAL
  Filled 2014-01-16: qty 2

## 2014-01-16 MED ORDER — IBUPROFEN 600 MG PO TABS: 600.0000 mg | ORAL_TABLET | Freq: Four times a day (QID) | ORAL | Status: DC | PRN

## 2014-01-16 NOTE — ED Provider Notes (Signed)
CSN: [id number]     Arrival date & time 01/16/14  1319 History   First MD Initiated Contact with Patient 01/16/14 1356    This chart was scribed for Junius FinnerErin O'Malley PA-C, a non-physician practitioner working with Junius ArgyleForrest S Harrison, MD by Lewanda RifeAlexandra Hurtado, ED Scribe. This patient was seen in room TR09C/TR09C and the patient's care was started at 3:09 PM     Chief Complaint  Patient presents with  . Leg Pain   (Consider location/radiation/quality/duration/timing/severity/associated sxs/prior Treatment) The history is provided by the patient. No language interpreter was used.   HPI Comments: Luke Mullins is a 43 y.o. male who presents to the Emergency Department complaining of constant moderate right upper posterior leg pain onset this morning following "slidding down steps on right side". Describes pain as "tight". Denies trying any alleviating factors. Reports pain is exacerbated by touch. Denies associated fall, head injury, neck pain, back pain, LOC, knee pain, hip pain, and numbness.  Past Medical History  Diagnosis Date  . Sarcoidosis    History reviewed. No pertinent past surgical history. No family history on file. History  Substance Use Topics  . Smoking status: Current Every Day Smoker  . Smokeless tobacco: Never Used  . Alcohol Use: Yes    Review of Systems  Constitutional: Negative for fever.  Musculoskeletal: Positive for myalgias. Negative for arthralgias and back pain.    Allergies  Bee venom  Home Medications   Current Outpatient Rx  Name  Route  Sig  Dispense  Refill  . EPINEPHrine (EPIPEN) 0.3 mg/0.3 mL SOAJ injection   Intramuscular   Inject 0.3 mLs (0.3 mg total) into the muscle as needed.   2 Device   1   . ibuprofen (ADVIL,MOTRIN) 600 MG tablet   Oral   Take 1 tablet (600 mg total) by mouth every 6 (six) hours as needed.   30 tablet   0    BP 129/90  Pulse 90  Temp(Src) 97.9 F (36.6 C) (Oral)  Resp 20  SpO2 95% Physical Exam  Nursing  note and vitals reviewed. Constitutional: He is oriented to person, place, and time. He appears well-developed and well-nourished. No distress.  HENT:  Head: Normocephalic and atraumatic.  Eyes: EOM are normal.  Neck: Normal range of motion. Neck supple.  Cardiovascular: Normal rate.   Pulmonary/Chest: Effort normal.  Musculoskeletal: Normal range of motion. He exhibits no edema and no tenderness.       Right hip: Normal. He exhibits normal range of motion and no tenderness.       Right knee: Normal. He exhibits normal range of motion. No tenderness found.   No erythema, deformity, and ecchymosis.  Neurological: He is alert and oriented to person, place, and time.  Able to ambulate with normal gait   Skin: Skin is warm and dry. He is not diaphoretic.  Psychiatric: He has a normal mood and affect. His behavior is normal.    ED Course  Procedures  COORDINATION OF CARE:  Nursing notes reviewed. Vital signs reviewed. Initial pt interview and examination performed.   3:15 PM-Discussed treatment plan with pt at bedside. Pt agrees with plan.   Treatment plan initiated: Medications  ibuprofen (ADVIL,MOTRIN) tablet 800 mg (800 mg Oral Given 01/16/14 1525)     Initial diagnostic testing ordered.    Labs Review Labs Reviewed - No data to display Imaging Review No results found.  EKG Interpretation   None       MDM   1. Contusion  of right thigh    Pt c/o right lateral thigh pain that started after falling on steps earlier this morning. Denies hitting head or LOC.  Denies hip or knee pain. Tender over right lateral thigh over musculature. Do not believe imaging needed at this time. Rx: ice, ibuprofen. Return precautions provided. Pt verbalized understanding and agreement with tx plan.    I personally performed the services described in this documentation, which was scribed in my presence. The recorded information has been reviewed and is accurate.    Junius Finner,  PA-C 01/16/14 1530

## 2014-01-16 NOTE — ED Notes (Signed)
Pt slipped on step then fell on it on right posterior  Upper leg and now with pain and states feels tight.  Pt states then slide down steps on same side

## 2014-01-17 NOTE — ED Provider Notes (Signed)
Medical screening examination/treatment/procedure(s) were performed by non-physician practitioner and as supervising physician I was immediately available for consultation/collaboration.  EKG Interpretation   None         Junius ArgyleForrest S Jerimey Burridge, MD 01/17/14 1226

## 2015-01-15 ENCOUNTER — Emergency Department (HOSPITAL_COMMUNITY)
Admission: EM | Admit: 2015-01-15 | Discharge: 2015-01-15 | Disposition: A | Discharge status: Home / Self Care | Payer: No Typology Code available for payment source | Attending: Emergency Medicine | Admitting: Emergency Medicine

## 2015-01-15 ENCOUNTER — Encounter (HOSPITAL_COMMUNITY): Payer: Self-pay | Admitting: Nurse Practitioner

## 2015-01-15 DIAGNOSIS — R319 Hematuria, unspecified: Secondary | ICD-10-CM

## 2015-01-15 DIAGNOSIS — Z720 Tobacco use: Secondary | ICD-10-CM | POA: Insufficient documentation

## 2015-01-15 DIAGNOSIS — Z862 Personal history of diseases of the blood and blood-forming organs and certain disorders involving the immune mechanism: Secondary | ICD-10-CM | POA: Insufficient documentation

## 2015-01-15 LAB — CBC WITH DIFFERENTIAL/PLATELET
BASOS PCT: 0 % (ref 0–1)
BLASTS: 0 %
Band Neutrophils: 0 % (ref 0–10)
Basophils Absolute: 0 10*3/uL (ref 0.0–0.1)
Eosinophils Absolute: 0.1 10*3/uL (ref 0.0–0.7)
Eosinophils Relative: 2 % (ref 0–5)
HCT: 42.5 % (ref 39.0–52.0)
HEMOGLOBIN: 14.8 g/dL (ref 13.0–17.0)
LYMPHS PCT: 16 % (ref 12–46)
Lymphs Abs: 0.7 10*3/uL (ref 0.7–4.0)
MCH: 33.9 pg (ref 26.0–34.0)
MCHC: 34.8 g/dL (ref 30.0–36.0)
MCV: 97.5 fL (ref 78.0–100.0)
MONOS PCT: 4 % (ref 3–12)
Metamyelocytes Relative: 0 %
Monocytes Absolute: 0.2 10*3/uL (ref 0.1–1.0)
Myelocytes: 0 %
NEUTROS ABS: 3.6 10*3/uL (ref 1.7–7.7)
Neutrophils Relative %: 78 % — ABNORMAL HIGH (ref 43–77)
PLATELETS: 295 10*3/uL (ref 150–400)
Promyelocytes Absolute: 0 %
RBC: 4.36 MIL/uL (ref 4.22–5.81)
RDW: 13.1 % (ref 11.5–15.5)
WBC: 4.6 10*3/uL (ref 4.0–10.5)
nRBC: 0 /100 WBC

## 2015-01-15 LAB — COMPREHENSIVE METABOLIC PANEL
ALK PHOS: 63 U/L (ref 39–117)
ALT: 11 U/L (ref 0–53)
AST: 28 U/L (ref 0–37)
Albumin: 4 g/dL (ref 3.5–5.2)
Anion gap: 7 (ref 5–15)
BILIRUBIN TOTAL: 0.7 mg/dL (ref 0.3–1.2)
BUN: 6 mg/dL (ref 6–23)
CO2: 31 mmol/L (ref 19–32)
Calcium: 8.9 mg/dL (ref 8.4–10.5)
Chloride: 102 mmol/L (ref 96–112)
Creatinine, Ser: 0.99 mg/dL (ref 0.50–1.35)
GFR calc Af Amer: >90 mL/min (ref >90)
Glucose, Bld: 137 mg/dL — ABNORMAL HIGH (ref 70–99)
Potassium: 3.5 mmol/L (ref 3.5–5.1)
SODIUM: 140 mmol/L (ref 135–145)
Total Protein: 6.9 g/dL (ref 6.0–8.3)

## 2015-01-15 LAB — URINALYSIS, ROUTINE W REFLEX MICROSCOPIC
GLUCOSE, UA: NEGATIVE mg/dL
KETONES UR: NEGATIVE mg/dL
Nitrite: NEGATIVE
Protein, ur: NEGATIVE mg/dL
Specific Gravity, Urine: 1.022 (ref 1.005–1.030)
UROBILINOGEN UA: 0.2 mg/dL (ref 0.0–1.0)
pH: 5.5 (ref 5.0–8.0)

## 2015-01-15 LAB — URINE MICROSCOPIC-ADD ON

## 2015-01-15 LAB — CK: Total CK: 248 U/L — ABNORMAL HIGH (ref 7–232)

## 2015-01-15 NOTE — ED Notes (Signed)
He c/o dark red brown colored urine since yesterday. He saw dried blood on tip of penis when he woke this morning. Denies any pain.

## 2015-01-15 NOTE — Discharge Instructions (Signed)

## 2015-01-15 NOTE — ED Provider Notes (Signed)
CSN: [id number]     Arrival date & time 01/15/15  1123 History   First MD Initiated Contact with Patient 01/15/15 1132     Chief Complaint  Patient presents with  . Dysuria     (Consider location/radiation/quality/duration/timing/severity/associated sxs/prior Treatment) HPI   Luke Mullins is a 44 y.o. male who presents for evaluation of dark colored urine.  This is occurred twice since yesterday.  The dark-colored urine is at the start of his urine stream, then improves to normal color, while he is urinating.  Today he also saw some dried dark material at the tip of his penis.  He thought that it might have looked somewhat red, like blood.  He denies penis scrotal or testicular pain.  There's been no low back pain.  He has occasional left-sided abdominal pain.  He denies trauma, liver failure, hypertension or history of kidney stones.  He states that he drinks a fair amount of alcohol.  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  He does not have a primary care doctor.  There are no other known modifying factors.   Past Medical History  Diagnosis Date  . Sarcoidosis    History reviewed. No pertinent past surgical history. History reviewed. No pertinent family history. History  Substance Use Topics  . Smoking status: Current Every Day Smoker  . Smokeless tobacco: Never Used  . Alcohol Use: Yes    Review of Systems  All other systems reviewed and are negative.     Allergies  Bee venom  Home Medications   Prior to Admission medications   Medication Sig Start Date End Date Taking? Authorizing Provider  EPINEPHrine (EPIPEN) 0.3 mg/0.3 mL SOAJ injection Inject 0.3 mLs (0.3 mg total) into the muscle as needed. 09/15/13  Yes Fredirick LatheAndrew Peter Wong, MD  ibuprofen (ADVIL,MOTRIN) 600 MG tablet Take 1 tablet (600 mg total) by mouth every 6 (six) hours as needed. Patient not taking: Reported on 01/15/2015 01/16/14   Junius FinnerErin O'Malley, PA-C   BP 129/79 mmHg   Pulse 77  Temp(Src) 98.3 F (36.8 C) (Oral)  Resp 12  Ht 5\' 6"  (1.676 m)  Wt 132 lb (59.875 kg)  BMI 21.32 kg/m2  SpO2 98% Physical Exam  Constitutional: He is oriented to person, place, and time. He appears well-developed. No distress.  Slender appearing  HENT:  Head: Normocephalic and atraumatic.  Right Ear: External ear normal.  Left Ear: External ear normal.  Eyes: Conjunctivae and EOM are normal. Pupils are equal, round, and reactive to light.  Neck: Normal range of motion and phonation normal. Neck supple.  Cardiovascular: Normal rate, regular rhythm and normal heart sounds.   Pulmonary/Chest: Effort normal and breath sounds normal. He exhibits no bony tenderness.  Abdominal: Soft. He exhibits no mass. There is no tenderness. There is no rebound and no guarding.  Genitourinary:  Circumcised.  Normal penis.  No urethral discharge or bleeding.  Normal scrotum, and testicles, bilateral.  Musculoskeletal: Normal range of motion.  Neurological: He is alert and oriented to person, place, and time. No cranial nerve deficit or sensory deficit. He exhibits normal muscle tone. Coordination normal.  Skin: Skin is warm, dry and intact.  Psychiatric: He has a normal mood and affect. His behavior is normal. Judgment and thought content normal.  Nursing note and vitals reviewed.   ED Course  Procedures (including critical care time) Labs Review Labs Reviewed  URINALYSIS, ROUTINE W REFLEX MICROSCOPIC - Abnormal; Notable  for the following:    Color, Urine AMBER (*)    APPearance CLOUDY (*)    Hgb urine dipstick LARGE (*)    Bilirubin Urine SMALL (*)    Leukocytes, UA SMALL (*)    All other components within normal limits  COMPREHENSIVE METABOLIC PANEL - Abnormal; Notable for the following:    Glucose, Bld 137 (*)    All other components within normal limits  CBC WITH DIFFERENTIAL/PLATELET - Abnormal; Notable for the following:    Neutrophils Relative % 78 (*)    All other components  within normal limits  CK - Abnormal; Notable for the following:    Total CK 248 (*)    All other components within normal limits  URINE MICROSCOPIC-ADD ON - Abnormal; Notable for the following:    Bacteria, UA FEW (*)    All other components within normal limits  URINE CULTURE  RPR  HIV ANTIBODY (ROUTINE TESTING)  GC/CHLAMYDIA PROBE AMP ([place])    Imaging Review No results found.   EKG Interpretation None      MDM   Final diagnoses:  Hematuria    Hematuria, mild, without compromising effect.  Doubt urinary tract infection, rhabdomyolysis, or metabolic instability.  Screening was done for STD.  Nursing Notes Reviewed/ Care Coordinated Applicable Imaging Reviewed Interpretation of Laboratory Data incorporated into ED treatment  The patient appears reasonably screened and/or stabilized for discharge and I doubt any other medical condition or other [facility] requiring further screening, evaluation, or treatment in the ED at this time prior to discharge.  Plan: Home Medications- none; Home Treatments- Rest; return here if the recommended treatment, does not improve the symptoms; Recommended follow up- Urology 1 week for check up     Flint Melter, MD 01/15/15 [phone]

## 2015-01-16 LAB — HIV ANTIBODY (ROUTINE TESTING W REFLEX)
HIV 1/HIV 2 AB: NONREACTIVE
HIV 1/O/2 Abs-Index Value: <1 (ref <1.00)

## 2015-01-16 LAB — RPR: RPR: NONREACTIVE

## 2015-01-17 LAB — URINE CULTURE
Colony Count: NO GROWTH
Culture: NO GROWTH

## 2015-01-17 LAB — GC/CHLAMYDIA PROBE AMP (~~LOC~~) NOT AT ARMC
Chlamydia: NEGATIVE
NEISSERIA GONORRHEA: NEGATIVE

## 2016-01-09 ENCOUNTER — Emergency Department (HOSPITAL_COMMUNITY)
Admission: EM | Admit: 2016-01-09 | Discharge: 2016-01-09 | Disposition: A | Discharge status: Home / Self Care | Payer: BC Managed Care – PPO | Attending: Emergency Medicine | Admitting: Emergency Medicine

## 2016-01-09 ENCOUNTER — Encounter (HOSPITAL_COMMUNITY): Payer: Self-pay | Admitting: Emergency Medicine

## 2016-01-09 DIAGNOSIS — F17200 Nicotine dependence, unspecified, uncomplicated: Secondary | ICD-10-CM | POA: Diagnosis not present

## 2016-01-09 DIAGNOSIS — K029 Dental caries, unspecified: Secondary | ICD-10-CM | POA: Diagnosis not present

## 2016-01-09 DIAGNOSIS — K002 Abnormalities of size and form of teeth: Secondary | ICD-10-CM | POA: Insufficient documentation

## 2016-01-09 DIAGNOSIS — K047 Periapical abscess without sinus: Secondary | ICD-10-CM | POA: Diagnosis not present

## 2016-01-09 DIAGNOSIS — K0889 Other specified disorders of teeth and supporting structures: Secondary | ICD-10-CM | POA: Diagnosis present

## 2016-01-09 MED ORDER — IBUPROFEN 400 MG PO TABS
800.0000 mg | ORAL_TABLET | Freq: Once | ORAL | Status: AC
Start: 2016-01-09 — End: 2016-01-09
  Administered 2016-01-09: 800 mg via ORAL
  Filled 2016-01-09: qty 2

## 2016-01-09 MED ORDER — IBUPROFEN 800 MG PO TABS: 800.0000 mg | ORAL_TABLET | Freq: Three times a day (TID) | ORAL | Status: DC

## 2016-01-09 MED ORDER — CLINDAMYCIN HCL 150 MG PO CAPS: 450.0000 mg | ORAL_CAPSULE | Freq: Three times a day (TID) | ORAL | Status: DC

## 2016-01-09 NOTE — Discharge Instructions (Signed)
Dental Abscess °A dental abscess is a collection of pus in or around a tooth. °CAUSES °This condition is caused by a bacterial infection around the root of the tooth that involves the inner part of the tooth (pulp). It may result from: °· Severe tooth decay. °· Trauma to the tooth that allows bacteria to enter into the pulp, such as a broken or chipped tooth. °· Severe gum disease around a tooth. °SYMPTOMS °Symptoms of this condition include: °· Severe pain in and around the infected tooth. °· Swelling and redness around the infected tooth, in the mouth, or in the face. °· Tenderness. °· Pus drainage. °· Bad breath. °· Bitter taste in the mouth. °· Difficulty swallowing. °· Difficulty opening the mouth. °· Nausea. °· Vomiting. °· Chills. °· Swollen neck glands. °· Fever. °DIAGNOSIS °This condition is diagnosed with examination of the infected tooth. During the exam, your dentist may tap on the infected tooth. Your dentist will also ask about your medical and dental history and may order X-rays. °TREATMENT °This condition is treated by eliminating the infection. This may be done with: °· Antibiotic medicine. °· A root canal. This may be performed to save the tooth. °· Pulling (extracting) the tooth. This may also involve draining the abscess. This is done if the tooth cannot be saved. °HOME CARE INSTRUCTIONS °· Take medicines only as directed by your dentist. °· If you were prescribed antibiotic medicine, finish all of it even if you start to feel better. °· Rinse your mouth (gargle) often with salt water to relieve pain or swelling. °· Do not drive or operate heavy machinery while taking pain medicine. °· Do not apply heat to the outside of your mouth. °· Keep all follow-up visits as directed by your dentist. This is important. °SEEK MEDICAL CARE IF: °· Your pain is worse and is not helped by medicine. °SEEK IMMEDIATE MEDICAL CARE IF: °· You have a fever or chills. °· Your symptoms suddenly get worse. °· You have a  very bad headache. °· You have problems breathing or swallowing. °· You have trouble opening your mouth. °· You have swelling in your neck or around your eye. °  °This information is not intended to replace advice given to you by your health care provider. Make sure you discuss any questions you have with your health care provider. °  °Document Released: 12/10/2005 Document Revised: 04/26/2015 Document Reviewed: 12/07/2014 °Elsevier Interactive Patient Education ©2016 Elsevier Inc. ° °Emergency Department Resource Guide °1) Find a Doctor and Pay Out of Pocket °Although you won't have to find out who is covered by your insurance plan, it is a good idea to ask around and get recommendations. You will then need to call the office and see if the doctor you have chosen will accept you as a new patient and what types of options they offer for patients who are self-pay. Some doctors offer discounts or will set up payment plans for their patients who do not have insurance, but you will need to ask so you aren't surprised when you get to your appointment. ° °2) Contact Your Local Health Department °Not all health departments have doctors that can see patients for sick visits, but many do, so it is worth a call to see if yours does. If you don't know where your local health department is, you can check in your phone book. The CDC also has a tool to help you locate your state's health department, and many state websites also have listings   of all of their local health departments. ° °3) Find a Walk-in Clinic °If your illness is not likely to be very severe or complicated, you may want to try a walk in clinic. These are popping up all over the country in pharmacies, drugstores, and shopping centers. They're usually staffed by nurse practitioners or physician assistants that have been trained to treat common illnesses and complaints. They're usually fairly quick and inexpensive. However, if you have serious medical issues or  chronic medical problems, these are probably not your best option. ° °No Primary Care Doctor: °- Call [facility] at  832-8000 - they can help you locate a primary care doctor that  accepts your insurance, provides certain services, etc. °- Physician Referral Service- 1-800-533-3463 ° °Chronic Pain Problems: °Organization         Address  Phone   Notes  ° [facility]  (336) 297-2271 Patients need to be referred by their primary care doctor.  ° °Medication Assistance: °Organization         Address  Phone   Notes  °[place] [facility] [address] °[place], [place] [address] (336) 641-8030 --Must be a resident of [place] °-- Must have NO insurance coverage whatsoever (no Medicaid/ Medicare, etc.) °-- The pt. MUST have a primary care doctor that directs their care regularly and follows them in the community °  °[facility]  (866) 331-1348   °[employer]  (888) 892-1162   ° °Agencies that provide inexpensive medical care: °Organization         Address  Phone   Notes  °[facility]  (336) 832-8035   ° Internal Medicine    (336) 832-7272   °[facility] [address] °[place], [place] [address] (336) 832-4777   °[facility] [address], °[place] (336) 271-4999   °[facility]    (336) 373-0678   °[facility]    (336) 272-1050   °[facility] ° [address], [place] Phone:  (336) 832-4444, Fax:  (336) 832-4440 Hours of Operation:  9 am - 6 pm, M-F.  Also accepts Medicaid/Medicare and self-pay.  °[facility] ° [address], [place] Phone: (336) 832-3150, Fax: (336) 832-3151. Hours of Operation:  8:30 am - 5:30 pm, M-F.  Also accepts Medicaid and self-pay.  °[facility] [place] [address], [place] Phone: (336) 878-6027   °[facility] [address], [place], [place]  (336)723-1848, Ext. 123 Mondays & Thursdays: 7-9 AM.  First 15 patients are seen on a first come, first serve basis. °  ° °Medicaid-accepting [place] Providers: ° °Organization         Address  Phone   Notes  °[facility] [address], [place] (336) 641-2100 Also accepts self-pay patients.  °[facility] [address], [place] ° (336) 856-9996   °[facility] [address], [place] (336) 288-8857   °[facility] [address] [place] [address], [place] (336) 299-7000   °Veita Bland [address],   ° (336) 373-1557 Only accepts [place] Access Medicaid patients after they have their name applied to their card.  ° °Self-Pay (no insurance) in [place]: ° °Organization         Address  Phone   Notes  °  Sickle Cell Patients, [facility] [address], [place] (336) 832-1970   °[facility] [address], [place] (336) 832-4400   °[facility] ° [address],  (336) 992-4800   °[facility]/Dr. Osei-Bonsu ° [address] [place] [address], [place] or [address], [place] (336) 841-8500 Phone number for both [place] and [place] locations is the same.  °[facility] [address], [place] (336) 299-0000   °[facility] [address] [place] [address], [place] or [address] (336) 852-7530 °(336) 878-2260   °[facility] [address], [place] (336) 350-1642, phone; (336) 294-5005, fax Sees patients 1st and 3rd Saturday of every month.  Must not qualify for public or private insurance (i.e. Medicaid, Medicare, [place] Health Choice, Veterans' Benefits) • Household income should be no more than 200% of the poverty level •The clinic cannot treat you if you are pregnant or think you are pregnant • Sexually transmitted  diseases are not treated at the clinic.  ° ° °Dental Care: °Organization         Address  Phone  Notes  °[place] [facility] [address], [employer] (336) 641-6152 Accepts children up to age 21 who are enrolled in Medicaid or [place] Health Choice; pregnant women with a Medicaid card; and children who have applied for Medicaid or [place] Health Choice, but were declined, whose parents can pay a reduced fee at time of service.  °[facility] [place]  [address], [place] (336) 641-7733 Accepts children up to age 21 who are enrolled in Medicaid or [place] Health Choice; pregnant women with a Medicaid card; and children who have applied for Medicaid or [employer] Health Choice, but were declined, whose parents can pay a reduced fee at time of service.  °[facility] ° [address], [place] (336) 641-4533 Patients are seen by appointment only. Walk-ins are not accepted. [facility] will see patients 18 years of age and older. °Monday - Tuesday (8am-5pm) °Most Wednesdays (8:30-5pm) °$30 per visit, cash only  °[facility] ° [address], [place] (336) 641-4533 Patients are seen by appointment only. Walk-ins are not accepted. [facility] will see patients 18 years of age and older. °One Wednesday Evening (Monthly: Volunteer Based).  $30 per visit, cash only  °[facility]  (919) 537-3737 for adults; Children under age 4, call Graduate Pediatric Dentistry at ([phone]) [phone]. Children aged 4-14, please call (919) [phone] to request a pediatric application. ° Dental services are provided in all areas of dental care including fillings, crowns and bridges, complete and partial dentures, implants, gum treatment, root canals, and extractions. Preventive care is also provided. Treatment is provided to both adults and children. °Patients are selected via a  lottery and there is often a waiting list. °  °[facility] [address], °[place] ° (336) 763-8833 [email or web address] °  °[facility] [address], [place], [place] (336)723-1848, Ext. 123 Second and Fourth Thursday of each month, opens at 6:30 AM; Clinic ends at 9 AM.  Patients are seen on a first-come first-served basis, and a limited number are seen during each clinic.  ° °[facility] ° [address], [place],  (336) 723-7904   Eligibility   Requirements °You must have lived in [place], [place], or [place] counties for at least the last three months. °  You cannot be eligible for state or federal sponsored healthcare insurance, including Veterans Administration, Medicaid, or Medicare. °  You generally cannot be eligible for healthcare insurance through your employer.  °  How to apply: °Eligibility screenings are held every Tuesday and Wednesday afternoon from 1:00 pm until 4:00 pm. You do not need an appointment for the interview!  °[facility] [address], [place], [place] 336-631-2330   °[facility]  336-342-8273   °[facility]  336-703-3100   °[facility]  336-570-6415   ° °Behavioral Health Resources in the Community: °Intensive Outpatient Programs °Organization         Address  Phone  Notes  °[facility] [address], [place], [place] 336-878-6098   °[facility] [address], [place], [place] 336-832-9800   °[facility] [address], [place], [place] ° 336-882-2125   °[facility] [address],  °[place], [place] 1-800-853-5163 or 336-641-4981   °Substance Abuse Resources °Organization         Address  Phone  Notes  °[facility]  336-882-2125   °[facility]  336-784-9470   °[facility]  336-285-9073   °[facility]  336-845-3988   °Residential &  Outpatient Substance Abuse Program  1-800-659-3381   °Psychological Services °Organization         Address  Phone  Notes  ° Health  336- 832-9600   °[facility]  336- 378-7881   °[facility] [address], [place] 1-800-853-5163 or 336-641-4981   ° °Mobile Crisis Teams °Organization         Address  Phone  Notes  °[facility], Mobile Crisis Care Unit  1-877-626-1772   °Assertive °[facility] ° [address] [place], [place] 336-834-9664   °Sharon DeEsch [address] °[place] 336-554-5454   ° °Self-Help/Support Groups °Organization         Address  Phone             Notes  °[facility] [place] - variety of support groups  336- 373-1402 Call for more information  °Narcotics Anonymous (NA), [facility] [address], °[place]  2 meetings at this location  ° °Residential Treatment Programs °Organization         Address  Phone  Notes  °[facility] [address],    °[place]  1-866-801-8205   °[facility] ° [address], [place], [place] 704-293-8524   °[facility] [address], [place] 336-845-3988 Admissions: 8am-3pm M-F  °[facility] [address]    °[place], [place] 336-841-1104   °[facility] [address], [place], [place] 336-379-7146   °[facility] [address]  °[place], [place] 336-285-9073   °[facility] - Intensive Outpatient [address], [place], [place] 336-852-3033   °[facility] [address]  °[place], [place] 1-877-615-2722 or 336-784-9470   °[facility] [address] [place], [place] 336-227-7417 Accepts Medicaid  °[facility] [address]  °[place]  1-800-659-3381 Substance Abuse/Addiction Treatment  ° °[place] Behavioral Health Resources °Organization            Address  Phone  Notes  °[facility]  (888) 581-9988   °Julie Brannon, PhD [address] [place], [place]   (336) 349-5553 or (336) 951-0000   °[facility]   [address] °[place], [place] (336) 349-4454   °[facility] [address], [place], [place] (336) 342-8316 Insurance/Medicaid/sponsorship through [facility]  °[facility] [address]                                    [place], [place] (336) 342-8316 Therapy/tele-psych/case  °[facility] [address]  ° [place], [place] (336) 349-2233    °Dr. Arfeen  (336) 349-4544   °[facility] [place]  [employer] [place] [facility] 1) [address], [place] °2) [address], [place] °3)  [address], [place] (336) 349-3220 °(336) 342-7768 ° °(336) 342-8140   °[place] [facility] (336) 342-1394 or (336) 342-3537 (After Hours)    ° ° °

## 2016-01-09 NOTE — ED Notes (Signed)
Pt. reports worsening right lower dental pain for 3 days .

## 2016-01-09 NOTE — ED Provider Notes (Signed)
CSN: [id number]     Arrival date & time 01/09/16  0021 History   First MD Initiated Contact with Patient 01/09/16 0025     Chief Complaint  Patient presents with  . Dental Pain     (Consider location/radiation/quality/duration/timing/severity/associated sxs/prior Treatment) HPI   Luke Mullins is a 45 y.o. male with PMH significant for sarcoidosis who presents with constant, moderate right lower dental pain onset 3 days ago unrelieved by ibuprofen and BC powder. No modifying factors. Denies fever, chills, nausea, vomiting, neck pain, tongue swelling, drooling, or choking. Patient reports he does not have a dentist.   Past Medical History  Diagnosis Date  . Sarcoidosis (HCC)    History reviewed. No pertinent past surgical history. No family history on file. Social History  Substance Use Topics  . Smoking status: Current Every Day Smoker  . Smokeless tobacco: Never Used  . Alcohol Use: Yes    Review of Systems All other systems negative unless otherwise stated in HPI    Allergies  Bee venom  Home Medications   Prior to Admission medications   Medication Sig Start Date End Date Taking? Authorizing Provider  clindamycin (CLEOCIN) 150 MG capsule Take 3 capsules (450 mg total) by mouth 3 (three) times daily. 01/09/16   Cheri Fowler, PA-C  EPINEPHrine (EPIPEN) 0.3 mg/0.3 mL SOAJ injection Inject 0.3 mLs (0.3 mg total) into the muscle as needed. 09/15/13   Donalee Citrin, MD  ibuprofen (ADVIL,MOTRIN) 800 MG tablet Take 1 tablet (800 mg total) by mouth 3 (three) times daily. 01/09/16   Taisha Pennebaker, PA-C   BP 126/82 mmHg  Pulse 85  Temp(Src) 98.3 F (36.8 C) (Oral)  Resp 16  SpO2 97% Physical Exam  Constitutional: He is oriented to person, place, and time. He appears well-developed and well-nourished.  HENT:  Head: Normocephalic and atraumatic.  Mouth/Throat: Uvula is midline, oropharynx is clear and moist and mucous membranes are normal. No trismus in the jaw. Abnormal  dentition. Dental abscesses and dental caries present. No oropharyngeal exudate, posterior oropharyngeal edema or posterior oropharyngeal erythema.    No tongue swelling or facial swelling.  Airway patent. Patient tolerating secretions without difficulty.   Eyes: Conjunctivae are normal.  Neck: Normal range of motion. Neck supple.  No evidence of Ludwigs angina.  Cardiovascular: Normal rate, regular rhythm and normal heart sounds.   Pulmonary/Chest: Effort normal and breath sounds normal.  Abdominal: Bowel sounds are normal. He exhibits no distension.  Musculoskeletal:  Moves all extremities spontaneously.  Lymphadenopathy:    He has no cervical adenopathy.  Neurological: He is alert and oriented to person, place, and time.  Speech clear without dysarthria.   Skin: Skin is warm and dry.    ED Course  Procedures (including critical care time)  INCISION AND DRAINAGE Performed by: Cheri Fowler Consent: Verbal consent obtained. Risks and benefits: risks, benefits and alternatives were discussed Type: abscess  Body area: Right lower jaw  Anesthesia: None  Incision was made with a 21-gauge needle  Local anesthetic: None   Anesthetic total: N/A  Complexity: simple  Drainage: purulent  Drainage amount: moderate  Packing material: none  Patient tolerance: Patient tolerated the procedure well with no immediate complications.    Labs Review Labs Reviewed - No data to display  Imaging Review No results found. I have personally reviewed and evaluated these images and lab results as part of my medical decision-making.   EKG Interpretation None      MDM   Final diagnoses:  Abscess, dental    Suspect dental pain associated with dental infection and possible dental abscess with patient afebrile, non toxic appearing and swallowing secretions well. I&D performed resulting in moderate purulent drainage. I gave patient referral to dentist and stressed the importance of  dental follow up for ultimate management of dental pain. Discussed return precautions.  Patient expresses understanding and agrees with plan.  I will also give clindamycin and pain control.      Cheri FowlerKayla Capricia Serda, PA-C 01/09/16 0142  April Palumbo, MD 01/09/16 [phone]

## 2017-02-18 ENCOUNTER — Encounter (HOSPITAL_COMMUNITY): Payer: Self-pay | Admitting: Emergency Medicine

## 2017-02-18 ENCOUNTER — Emergency Department (HOSPITAL_COMMUNITY)
Admission: EM | Admit: 2017-02-18 | Discharge: 2017-02-18 | Disposition: A | Discharge status: Home / Self Care | Payer: Self-pay | Attending: Emergency Medicine | Admitting: Emergency Medicine

## 2017-02-18 ENCOUNTER — Emergency Department (HOSPITAL_COMMUNITY): Payer: Self-pay

## 2017-02-18 DIAGNOSIS — Y9389 Activity, other specified: Secondary | ICD-10-CM | POA: Insufficient documentation

## 2017-02-18 DIAGNOSIS — Y999 Unspecified external cause status: Secondary | ICD-10-CM | POA: Insufficient documentation

## 2017-02-18 DIAGNOSIS — Y929 Unspecified place or not applicable: Secondary | ICD-10-CM | POA: Insufficient documentation

## 2017-02-18 DIAGNOSIS — F17200 Nicotine dependence, unspecified, uncomplicated: Secondary | ICD-10-CM | POA: Insufficient documentation

## 2017-02-18 DIAGNOSIS — M25552 Pain in left hip: Secondary | ICD-10-CM | POA: Insufficient documentation

## 2017-02-18 DIAGNOSIS — W010XXA Fall on same level from slipping, tripping and stumbling without subsequent striking against object, initial encounter: Secondary | ICD-10-CM | POA: Insufficient documentation

## 2017-02-18 MED ORDER — HYDROCODONE-ACETAMINOPHEN 5-325 MG PO TABS
1.0000 | ORAL_TABLET | Freq: Once | ORAL | Status: AC
  Administered 2017-02-18: 1 via ORAL
  Filled 2017-02-18: qty 1

## 2017-02-18 MED ORDER — KETOROLAC TROMETHAMINE 30 MG/ML IJ SOLN
30.0000 mg | Freq: Once | INTRAMUSCULAR | Status: AC
  Administered 2017-02-18: 30 mg via INTRAMUSCULAR
  Filled 2017-02-18: qty 1

## 2017-02-18 MED ORDER — NAPROXEN 500 MG PO TABS: 500.0000 mg | ORAL_TABLET | Freq: Two times a day (BID) | ORAL | 0 refills | Status: AC

## 2017-02-18 NOTE — ED Provider Notes (Signed)
[facility]-EMERGENCY DEPT Provider Note   CSN: [id number] Arrival date & time: 02/18/17  [id number]     History   Chief Complaint Chief Complaint  Patient presents with  . Fall  . Leg Pain  . Hip Pain    HPI Luke Mullins is a 46 y.o. male.  HPI  This is a [age] rolled male who presents with left hip pain. Patient reports that he was in his garage yesterday with his dogs when he got tripped up and fell onto his left hip. He reports persistent progressive left hip pain. He has been ambulatory. He states it is worse with bending. He took 2 ibuprofen with minimal relief. He has also tried ice with minimal relief. He rates his pain a 10 out of 10. Denies any back pain, weakness, numbness, tingling.  Past Medical History:  Diagnosis Date  . Sarcoidosis (HCC)     There are no active problems to display for this patient.   History reviewed. No pertinent surgical history.     Home Medications    Prior to Admission medications   Medication Sig Start Date End Date Taking? Authorizing Provider  clindamycin (CLEOCIN) 150 MG capsule Take 3 capsules (450 mg total) by mouth 3 (three) times daily. 01/09/16   Cheri FowlerKayla Rose, PA-C  EPINEPHrine (EPIPEN) 0.3 mg/0.3 mL SOAJ injection Inject 0.3 mLs (0.3 mg total) into the muscle as needed. 09/15/13   Donalee CitrinAndrew Wong, MD  ibuprofen (ADVIL,MOTRIN) 800 MG tablet Take 1 tablet (800 mg total) by mouth 3 (three) times daily. 01/09/16   Cheri FowlerKayla Rose, PA-C  naproxen (NAPROSYN) 500 MG tablet Take 1 tablet (500 mg total) by mouth 2 (two) times daily. 02/18/17   Shon Batonourtney F Cole Eastridge, MD    Family History No family history on file.  Social History Social History  Substance Use Topics  . Smoking status: Current Every Day Smoker  . Smokeless tobacco: Never Used  . Alcohol use Yes     Allergies   Bee venom   Review of Systems Review of Systems  Musculoskeletal:       Left hip pain  Skin: Negative for color change and wound.  Neurological: Negative for weakness and  numbness.  All other systems reviewed and are negative.    Physical Exam Updated Vital Signs BP (!) 159/108 (BP Location: Left Arm)   Pulse 84   Temp 98 F (36.7 C) (Oral)   Resp 18   Ht 5\' 6"  (1.676 m)   Wt 130 lb (59 kg)   SpO2 100%   BMI 20.98 kg/m   Physical Exam  Constitutional: He is oriented to person, place, and time. He appears well-developed and well-nourished. No distress.  HENT:  Head: Normocephalic and atraumatic.  Cardiovascular: Normal rate, regular rhythm and normal heart sounds.   Pulmonary/Chest: Effort normal and breath sounds normal. No respiratory distress. He has no wheezes.  Musculoskeletal: He exhibits no edema.  Tenderness to palpation left anterior hip, normal range of motion, pain with range of motion, no obvious deformities, 2+ DP pulse, no overlying skin changes  Neurological: He is alert and oriented to person, place, and time.  Skin: Skin is warm and dry.  Psychiatric: He has a normal mood and affect.  Nursing note and vitals reviewed.    ED Treatments / Results  Labs (all labs ordered are listed, but only abnormal results are displayed) Labs Reviewed - No data to display  EKG  EKG Interpretation None       Radiology Dg  Hip Unilat W Or Wo Pelvis 2-3 Views Left  Result Date: 02/18/2017 CLINICAL DATA:  Initial evaluation for acute trauma, fall. Pain and left groin. EXAM: DG HIP (WITH OR WITHOUT PELVIS) 2-3V LEFT COMPARISON:  None. FINDINGS: No acute fracture dislocation. Femoral heads in normal line within the acetabula. Femoral head heights preserved. Linear lucency traversing the left lesser trochanter are most consistent with a skin fold. Bony pelvis intact. SI joints approximated. No acute abnormality about the right hip. No soft tissue abnormality. IMPRESSION: No acute osseous abnormality about the left hip. Electronically Signed   By: Rise Mu M.D.   On: 02/18/2017 05:41    Procedures Procedures (including critical  care time)  Medications Ordered in ED Medications  ketorolac (TORADOL) 30 MG/ML injection 30 mg (30 mg Intramuscular Given 02/18/17 0434)  HYDROcodone-acetaminophen (NORCO/VICODIN) 5-325 MG per tablet 1 tablet (1 tablet Oral Given 02/18/17 0434)     Initial Impression / Assessment and Plan / ED Course  I have reviewed the triage vital signs and the nursing notes.  Pertinent labs & imaging results that were available during my care of the patient were reviewed by me and considered in my medical decision making (see chart for details).     Patient presents with left hip pain after fall. No evidence of hematoma or bruising noted. He is point tender but able to ambulate. X-rays are negative for fracture. Suspect musculoskeletal pain versus contusion. Naproxen at home for pain. Patient is ambulatory independently.  After history, exam, and medical workup I feel the patient has been appropriately medically screened and is safe for discharge home. Pertinent diagnoses were discussed with the patient. Patient was given return precautions.   Final Clinical Impressions(s) / ED Diagnoses   Final diagnoses:  Left hip pain    New Prescriptions New Prescriptions   NAPROXEN (NAPROSYN) 500 MG TABLET    Take 1 tablet (500 mg total) by mouth 2 (two) times daily.     Shon Baton, MD 02/18/17 [phone]

## 2017-02-18 NOTE — ED Triage Notes (Signed)
Pt reports playing with his dog yesterday when he tripped and fell causing pain to left hip, groin and leg. Pt has tried ice without relief.

## 2017-02-18 NOTE — Discharge Instructions (Signed)
You were seen today for left hip pain. There is no evidence of fracture.

## 2018-04-30 ENCOUNTER — Emergency Department (HOSPITAL_COMMUNITY)
Admission: EM | Admit: 2018-04-30 | Discharge: 2018-04-30 | Disposition: A | Discharge status: Home / Self Care | Payer: BC Managed Care – PPO | Attending: Emergency Medicine | Admitting: Emergency Medicine

## 2018-04-30 ENCOUNTER — Encounter (HOSPITAL_COMMUNITY): Payer: Self-pay

## 2018-04-30 DIAGNOSIS — F17200 Nicotine dependence, unspecified, uncomplicated: Secondary | ICD-10-CM | POA: Insufficient documentation

## 2018-04-30 DIAGNOSIS — Y9389 Activity, other specified: Secondary | ICD-10-CM | POA: Insufficient documentation

## 2018-04-30 DIAGNOSIS — Y999 Unspecified external cause status: Secondary | ICD-10-CM | POA: Insufficient documentation

## 2018-04-30 DIAGNOSIS — Y92410 Unspecified street and highway as the place of occurrence of the external cause: Secondary | ICD-10-CM | POA: Insufficient documentation

## 2018-04-30 DIAGNOSIS — Z79899 Other long term (current) drug therapy: Secondary | ICD-10-CM | POA: Insufficient documentation

## 2018-04-30 DIAGNOSIS — S46812A Strain of other muscles, fascia and tendons at shoulder and upper arm level, left arm, initial encounter: Secondary | ICD-10-CM | POA: Insufficient documentation

## 2018-04-30 DIAGNOSIS — M5432 Sciatica, left side: Secondary | ICD-10-CM

## 2018-04-30 MED ORDER — IBUPROFEN 400 MG PO TABS: 400.0000 mg | ORAL_TABLET | Freq: Four times a day (QID) | ORAL | 0 refills | Status: AC | PRN

## 2018-04-30 MED ORDER — ACETAMINOPHEN 325 MG PO TABS: 650.0000 mg | ORAL_TABLET | Freq: Four times a day (QID) | ORAL | 0 refills | Status: AC | PRN

## 2018-04-30 MED ORDER — METHOCARBAMOL 500 MG PO TABS: 500.0000 mg | ORAL_TABLET | Freq: Every evening | ORAL | 0 refills | Status: DC | PRN

## 2018-04-30 NOTE — Discharge Instructions (Signed)

## 2018-04-30 NOTE — ED Notes (Signed)
Pt verbalized understanding of discharge instructions and denies any further questions at this time.   
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
## 2018-04-30 NOTE — ED Notes (Signed)
Called to reassess. No answer.
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
## 2018-07-27 ENCOUNTER — Encounter (HOSPITAL_COMMUNITY): Payer: Self-pay | Admitting: Emergency Medicine

## 2018-07-27 ENCOUNTER — Other Ambulatory Visit: Payer: Self-pay

## 2018-07-27 ENCOUNTER — Emergency Department (HOSPITAL_COMMUNITY)
Admission: EM | Admit: 2018-07-27 | Discharge: 2018-07-27 | Disposition: A | Discharge status: Home / Self Care | Payer: BC Managed Care – PPO | Attending: Emergency Medicine | Admitting: Emergency Medicine

## 2018-07-27 DIAGNOSIS — W57XXXA Bitten or stung by nonvenomous insect and other nonvenomous arthropods, initial encounter: Secondary | ICD-10-CM | POA: Insufficient documentation

## 2018-07-27 DIAGNOSIS — Y939 Activity, unspecified: Secondary | ICD-10-CM | POA: Insufficient documentation

## 2018-07-27 DIAGNOSIS — Y929 Unspecified place or not applicable: Secondary | ICD-10-CM | POA: Insufficient documentation

## 2018-07-27 DIAGNOSIS — Y999 Unspecified external cause status: Secondary | ICD-10-CM | POA: Insufficient documentation

## 2018-07-27 DIAGNOSIS — T63481A Toxic effect of venom of other arthropod, accidental (unintentional), initial encounter: Secondary | ICD-10-CM

## 2018-07-27 DIAGNOSIS — L509 Urticaria, unspecified: Secondary | ICD-10-CM | POA: Insufficient documentation

## 2018-07-27 NOTE — ED Notes (Signed)
Small whelp noted to posterior left neck where pt reports being stung by a wasp today PTA. 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 Pt airway in tact, speaking in full sentences, no other hives noted. Pt does state that the tip of his tongue felt numb when he first got stung, denies this worsening at this time.

## 2018-07-27 NOTE — ED Provider Notes (Addendum)
[facility] Provider Note  CSN: [id number] Arrival date & time: 07/27/18  [id number]  History   Chief Complaint Chief Complaint  Patient presents with  . Allergic Reaction    HPI Luke Mullins is a 47 y.o. male with a medical history of sarcoidosis who presented to the ED for insect sting. He states he was stung by a wasp about 2 hours ago and has a hive on his upper back. He states the area is burning and itchy. Denies SOB, chest tightness, sore throat, HENT swelling. Patient has tried nothing prior to coming to the ED.  Past Medical History:  Diagnosis Date  . Sarcoidosis     There are no active problems to display for this patient.   History reviewed. No pertinent surgical history.      Home Medications    Prior to Admission medications   Medication Sig Start Date End Date Taking? Authorizing Provider  acetaminophen (TYLENOL) 325 MG tablet Take 2 tablets (650 mg total) by mouth every 6 (six) hours as needed. Do not take more than 4000mg  of tylenol per day 04/30/18   Couture, Cortni S, PA-C  clindamycin (CLEOCIN) 150 MG capsule Take 3 capsules (450 mg total) by mouth 3 (three) times daily. Patient not taking: Reported on 04/30/2018 01/09/16   Cheri Fowlerose, Kayla, PA-C  EPINEPHrine (EPIPEN) 0.3 mg/0.3 mL SOAJ injection Inject 0.3 mLs (0.3 mg total) into the muscle as needed. 09/15/13   Donalee CitrinWong, Andrew, MD  ibuprofen (ADVIL,MOTRIN) 400 MG tablet Take 1 tablet (400 mg total) by mouth every 6 (six) hours as needed. 04/30/18   Couture, Cortni S, PA-C  methocarbamol (ROBAXIN) 500 MG tablet Take 1 tablet (500 mg total) by mouth at bedtime as needed for muscle spasms. 04/30/18   Couture, Cortni S, PA-C  naproxen (NAPROSYN) 500 MG tablet Take 1 tablet (500 mg total) by mouth 2 (two) times daily. 02/18/17   Horton, Mayer Maskerourtney F, MD    Family History No family history on file.  Social History Social History   Tobacco Use  . Smoking status: Current Every Day Smoker  .  Smokeless tobacco: Never Used  Substance Use Topics  . Alcohol use: Yes  . Drug use: Yes    Types: Marijuana     Allergies   Bee venom   Review of Systems Review of Systems  Constitutional: Negative.   HENT: Negative.  Negative for facial swelling, sore throat and trouble swallowing.   Eyes: Negative.   Respiratory: Negative for cough, chest tightness and shortness of breath.   Cardiovascular: Negative for chest pain and palpitations.  Skin:       Sting on upper back     Physical Exam Updated Vital Signs BP (!) 136/105 (BP Location: Right Arm)   Pulse 88   Temp 98.2 F (36.8 C) (Oral)   Resp 16   Ht 5\' 6"  (1.676 m)   Wt 59 kg (130 lb)   SpO2 100%   BMI 20.98 kg/m   Physical Exam  Constitutional: He appears well-developed and well-nourished. No distress.  HENT:  Mouth/Throat: Oropharynx is clear and moist.  Eyes: Pupils are equal, round, and reactive to light. Conjunctivae and EOM are normal.  Neck: Normal range of motion. Neck supple.  Cardiovascular: Normal rate, regular rhythm and normal heart sounds.  Pulmonary/Chest: Effort normal and breath sounds normal.  Skin: Skin is warm and intact. Capillary refill takes less than 2 seconds. Rash noted. Rash is urticarial.  1.5 cm single urticaria on left trapezius muscle. No surrounding erythema. No drainage, bleeding, crusting, desquamation or ulcerations.  Nursing note and vitals reviewed.    ED Treatments / Results  Labs (all labs ordered are listed, but only abnormal results are displayed) Labs Reviewed - No data to display  EKG None  Radiology No results found.  Procedures Procedures (including critical care time)  Medications Ordered in ED Medications - No data to display   Initial Impression / Assessment and Plan / ED Course  Triage vital signs and the nursing notes have been reviewed.  Pertinent labs & imaging results that were available during care of the patient were reviewed and  considered in medical decision making (see chart for details).   Patient presents with a suspected wasp/bee sting on his back. Patient denies signs of anaphylaxis. He has a single urticaria on physical exam and reaction is still localized. Patient denies other s/s to suggest cellulitis.  Final Clinical Impressions(s) / ED Diagnoses  1. Insect Sting. Education provided on OTC and supportive treatment for relief.  Dispo: Home. After thorough clinical evaluation, this patient is determined to be medically stable and can be safely discharged with the previously mentioned treatment and/or outpatient follow-up/referral(s). At this time, there are no other apparent medical conditions that require further screening, evaluation or treatment.   Final diagnoses:  Insect stings, accidental or unintentional, initial encounter    ED Discharge Orders    None        Windy Carina, PA-C 07/27/18 [address], [place] 07/27/18 2033    Melene Plan, DO 07/27/18 2340

## 2018-07-27 NOTE — Discharge Instructions (Signed)
As we discussed, you can use OTC creams on that area on your back. Any OTC allergy medicine will assist with itching too.  Follow-up with a medical provider if the area gets bigger, hurts worse or you start to have reactions on other parts of your body.

## 2018-07-27 NOTE — ED Notes (Signed)
The pt has no other symptoms no rash  No itching no difficulty breathing

## 2018-07-27 NOTE — ED Triage Notes (Signed)
Pt states he was bit by wasp on the back of his neck. Raised area noted. Pt c/o itching, LS clear/denies shortness of breath, no hives noted.

## 2019-11-01 ENCOUNTER — Emergency Department (HOSPITAL_COMMUNITY): Payer: Self-pay

## 2019-11-01 ENCOUNTER — Other Ambulatory Visit: Payer: Self-pay

## 2019-11-01 ENCOUNTER — Emergency Department (HOSPITAL_COMMUNITY)
Admission: EM | Admit: 2019-11-01 | Discharge: 2019-11-01 | Disposition: A | Discharge status: Home / Self Care | Payer: Self-pay | Attending: Emergency Medicine | Admitting: Emergency Medicine

## 2019-11-01 DIAGNOSIS — N5089 Other specified disorders of the male genital organs: Secondary | ICD-10-CM | POA: Insufficient documentation

## 2019-11-01 DIAGNOSIS — Z791 Long term (current) use of non-steroidal anti-inflammatories (NSAID): Secondary | ICD-10-CM | POA: Insufficient documentation

## 2019-11-01 DIAGNOSIS — I861 Scrotal varices: Secondary | ICD-10-CM | POA: Insufficient documentation

## 2019-11-01 DIAGNOSIS — F17210 Nicotine dependence, cigarettes, uncomplicated: Secondary | ICD-10-CM | POA: Insufficient documentation

## 2019-11-01 DIAGNOSIS — R1013 Epigastric pain: Secondary | ICD-10-CM | POA: Insufficient documentation

## 2019-11-01 LAB — COMPREHENSIVE METABOLIC PANEL
ALT: 45 U/L — ABNORMAL HIGH (ref 0–44)
AST: 124 U/L — ABNORMAL HIGH (ref 15–41)
Albumin: 4 g/dL (ref 3.5–5.0)
Alkaline Phosphatase: 75 U/L (ref 38–126)
Anion gap: 11 (ref 5–15)
BUN: 7 mg/dL (ref 6–20)
CO2: 27 mmol/L (ref 22–32)
Calcium: 8.9 mg/dL (ref 8.9–10.3)
Chloride: 99 mmol/L (ref 98–111)
Creatinine, Ser: 1 mg/dL (ref 0.61–1.24)
GFR calc Af Amer: >60 mL/min (ref >60)
GFR calc non Af Amer: >60 mL/min (ref >60)
Glucose, Bld: 170 mg/dL — ABNORMAL HIGH (ref 70–99)
Potassium: 3.5 mmol/L (ref 3.5–5.1)
Sodium: 137 mmol/L (ref 135–145)
Total Bilirubin: 0.6 mg/dL (ref 0.3–1.2)
Total Protein: 7.4 g/dL (ref 6.5–8.1)

## 2019-11-01 LAB — CBC
HCT: 44.8 % (ref 39.0–52.0)
Hemoglobin: 15.2 g/dL (ref 13.0–17.0)
MCH: 34.1 pg — ABNORMAL HIGH (ref 26.0–34.0)
MCHC: 33.9 g/dL (ref 30.0–36.0)
MCV: 100.4 fL — ABNORMAL HIGH (ref 80.0–100.0)
Platelets: 275 10*3/uL (ref 150–400)
RBC: 4.46 MIL/uL (ref 4.22–5.81)
RDW: 13.2 % (ref 11.5–15.5)
WBC: 4.8 10*3/uL (ref 4.0–10.5)
nRBC: 0 % (ref 0.0–0.2)

## 2019-11-01 LAB — URINALYSIS, ROUTINE W REFLEX MICROSCOPIC
Bilirubin Urine: NEGATIVE
Glucose, UA: NEGATIVE mg/dL
Hgb urine dipstick: NEGATIVE
Ketones, ur: NEGATIVE mg/dL
Nitrite: NEGATIVE
Protein, ur: 30 mg/dL — AB
Specific Gravity, Urine: 1.034 — ABNORMAL HIGH (ref 1.005–1.030)
WBC, UA: >50 WBC/hpf — ABNORMAL HIGH (ref 0–5)
pH: 5 (ref 5.0–8.0)

## 2019-11-01 LAB — LIPASE, BLOOD: Lipase: 27 U/L (ref 11–51)

## 2019-11-01 MED ORDER — ONDANSETRON HCL 4 MG/2ML IJ SOLN
4.0000 mg | Freq: Once | INTRAMUSCULAR | Status: AC
  Administered 2019-11-01: 05:00:00 4 mg via INTRAVENOUS
  Filled 2019-11-01: qty 2

## 2019-11-01 MED ORDER — PANTOPRAZOLE SODIUM 40 MG PO TBEC
40.0000 mg | DELAYED_RELEASE_TABLET | Freq: Once | ORAL | Status: AC
  Administered 2019-11-01: 09:00:00 40 mg via ORAL
  Filled 2019-11-01: qty 1

## 2019-11-01 MED ORDER — SODIUM CHLORIDE 0.9 % IV BOLUS
500.0000 mL | Freq: Once | INTRAVENOUS | Status: AC
  Administered 2019-11-01: 500 mL via INTRAVENOUS

## 2019-11-01 MED ORDER — ALUM & MAG HYDROXIDE-SIMETH 200-200-20 MG/5ML PO SUSP
30.0000 mL | Freq: Once | ORAL | Status: AC
  Administered 2019-11-01: 07:00:00 30 mL via ORAL
  Filled 2019-11-01: qty 30

## 2019-11-01 MED ORDER — MORPHINE SULFATE (PF) 4 MG/ML IV SOLN
4.0000 mg | Freq: Once | INTRAVENOUS | Status: AC
  Administered 2019-11-01: 05:00:00 4 mg via INTRAVENOUS
  Filled 2019-11-01: qty 1

## 2019-11-01 MED ORDER — OMEPRAZOLE 20 MG PO CPDR: 20.0000 mg | DELAYED_RELEASE_CAPSULE | Freq: Every day | ORAL | 0 refills | Status: AC

## 2019-11-01 MED ORDER — ONDANSETRON HCL 4 MG PO TABS: 4.0000 mg | ORAL_TABLET | Freq: Three times a day (TID) | ORAL | 0 refills | Status: AC | PRN

## 2019-11-01 NOTE — ED Provider Notes (Signed)
[facility] EMERGENCY [facility] Provider Note   CSN: [id number] Arrival date & time: 11/01/19  0342     History   Chief Complaint Chief Complaint  Patient presents with   Abdominal Pain    HPI Luke Mullins is a 48 y.o. male with a hx of sarcoidosis presents to the Emergency Department complaining of gradual, persistent, progressively worsening left-sided abdominal pain onset around 8 AM this morning.  Patient reports that Friday night he drank a lot of tequila.  He reports that he drinks on a nightly basis but had more than usual on Friday night.  He awoke Saturday morning with abdominal pain that has been persistent throughout the day.  He denies fevers or chills, chest pain, shortness of breath, vomiting, diarrhea, weakness, dizziness, syncope.  Patient does have some associated nausea.  He reports taking Pepto-Bismol with minimal relief.  He had a bowel movement approximately 1 hour prior to arrival that was nonbloody and nonbilious.  He reports normal formed stool.  Patient reports he smokes cigarettes and marijuana daily.  He denies history of pancreatitis however he reports he has had pain like this previously after large-volume alcohol consumption.  Additionally, patient complains of left testicular pain.  He denies dysuria, hematuria or difficulty urinating.  He denies penile discharge.  He denies a history of ovarian torsion or known hernia.     The history is provided by the patient and medical records. No language interpreter was used.    Past Medical History:  Diagnosis Date   Sarcoidosis     There are no active problems to display for this patient.   No past surgical history on file.      Home Medications    Prior to Admission medications   Medication Sig Start Date End Date Taking? Authorizing Provider  acetaminophen (TYLENOL) 325 MG tablet Take 2 tablets (650 mg total) by mouth every 6 (six) hours as needed. Do not take more than 4000mg   of tylenol per day 04/30/18   Couture, Cortni S, PA-C  clindamycin (CLEOCIN) 150 MG capsule Take 3 capsules (450 mg total) by mouth 3 (three) times daily. Patient not taking: Reported on 04/30/2018 01/09/16   Cheri Fowlerose, Kayla, PA-C  EPINEPHrine (EPIPEN) 0.3 mg/0.3 mL SOAJ injection Inject 0.3 mLs (0.3 mg total) into the muscle as needed. 09/15/13   Donalee CitrinWong, Andrew, MD  ibuprofen (ADVIL,MOTRIN) 400 MG tablet Take 1 tablet (400 mg total) by mouth every 6 (six) hours as needed. 04/30/18   Couture, Cortni S, PA-C  methocarbamol (ROBAXIN) 500 MG tablet Take 1 tablet (500 mg total) by mouth at bedtime as needed for muscle spasms. 04/30/18   Couture, Cortni S, PA-C  naproxen (NAPROSYN) 500 MG tablet Take 1 tablet (500 mg total) by mouth 2 (two) times daily. 02/18/17   Horton, Mayer Maskerourtney F, MD    Family History No family history on file.  Social History Social History   Tobacco Use   Smoking status: Current Every Day Smoker   Smokeless tobacco: Never Used  Substance Use Topics   Alcohol use: Yes   Drug use: Yes    Types: Marijuana     Allergies   Bee venom   Review of Systems Review of Systems  Constitutional: Negative for appetite change, diaphoresis, fatigue, fever and unexpected weight change.  HENT: Negative for mouth sores.   Eyes: Negative for visual disturbance.  Respiratory: Negative for cough, chest tightness, shortness of breath and wheezing.   Cardiovascular: Negative for chest pain.  Gastrointestinal: Positive for abdominal pain and nausea. Negative for constipation, diarrhea and vomiting.  Endocrine: Negative for polydipsia, polyphagia and polyuria.  Genitourinary: Positive for testicular pain. Negative for dysuria, frequency, hematuria and urgency.  Musculoskeletal: Negative for back pain and neck stiffness.  Skin: Negative for rash.  Allergic/Immunologic: Negative for immunocompromised state.  Neurological: Negative for syncope, light-headedness and headaches.  Hematological: Does  not bruise/bleed easily.  Psychiatric/Behavioral: Negative for sleep disturbance. The patient is not nervous/anxious.      Physical Exam Updated Vital Signs BP (!) 134/99    Pulse 86    Temp 98.9 F (37.2 C) (Oral)    Resp 16    Ht 6' (1.829 m)    Wt 59 kg    SpO2 100%    BMI 17.63 kg/m   Physical Exam Vitals signs and nursing note reviewed. Exam conducted with a chaperone present.  Constitutional:      General: He is not in acute distress.    Appearance: He is not diaphoretic.  HENT:     Head: Normocephalic.  Eyes:     General: No scleral icterus.    Conjunctiva/sclera: Conjunctivae normal.  Neck:     Musculoskeletal: Normal range of motion.  Cardiovascular:     Rate and Rhythm: Normal rate and regular rhythm.     Pulses: Normal pulses.          Radial pulses are 2+ on the right side and 2+ on the left side.  Pulmonary:     Effort: No tachypnea, accessory muscle usage, prolonged expiration, respiratory distress or retractions.     Breath sounds: Normal breath sounds. No stridor. No wheezing.     Comments: Equal chest rise. No increased work of breathing. Abdominal:     General: Bowel sounds are normal. There is no distension.     Palpations: Abdomen is soft.     Tenderness: There is abdominal tenderness in the epigastric area, periumbilical area, left upper quadrant and left lower quadrant. There is no right CVA tenderness, left CVA tenderness, guarding or rebound.     Hernia: There is no hernia in the left inguinal area or right inguinal area.  Genitourinary:    Penis: Normal and circumcised.      Scrotum/Testes:        Right: Mass, tenderness, swelling, testicular hydrocele or varicocele not present. Right testis is descended. Cremasteric reflex is present.         Left: Tenderness present. Mass, swelling, testicular hydrocele or varicocele not present. Cremasteric reflex is absent.   Musculoskeletal:     Comments: Moves all extremities equally and without difficulty.    Lymphadenopathy:     Lower Body: No right inguinal adenopathy. No left inguinal adenopathy.  Skin:    General: Skin is warm and dry.     Capillary Refill: Capillary refill takes less than 2 seconds.  Neurological:     Mental Status: He is alert.     GCS: GCS eye subscore is 4. GCS verbal subscore is 5. GCS motor subscore is 6.     Comments: Speech is clear and goal oriented.  Psychiatric:        Mood and Affect: Mood normal.      ED Treatments / Results  Labs (all labs ordered are listed, but only abnormal results are displayed) Labs Reviewed  COMPREHENSIVE METABOLIC PANEL - Abnormal; Notable for the following components:      Result Value   Glucose, Bld 170 (*)    AST 124 (*)  ALT 45 (*)    All other components within normal limits  CBC - Abnormal; Notable for the following components:   MCV 100.4 (*)    MCH 34.1 (*)    All other components within normal limits  URINALYSIS, ROUTINE W REFLEX MICROSCOPIC - Abnormal; Notable for the following components:   APPearance HAZY (*)    Specific Gravity, Urine 1.034 (*)    Protein, ur 30 (*)    Leukocytes,Ua MODERATE (*)    WBC, UA >50 (*)    Bacteria, UA RARE (*)    All other components within normal limits  LIPASE, BLOOD    Radiology US Scrotum W/doppler  Result Date: 11/01/2019 CLINICAL DATA:  Lower abdominal pain since yesterday and left testicular pain. EXAM: SCROTAL ULTRASOUND DOPPLER ULTRASOUND OF THE TESTICLES TECHNIQUE: Complete ultrasound examination of the testicles, epididymis, and other scrotal structures was performed. Color and spectral Doppler ultrasound were also utilized to evaluate blood flow to the testicles. COMPARISON:  None. FINDINGS: Right testicle Measurements: 4.4 x 2.1 x 1.9 cm. No mass or microlithiasis visualized. Left testicle Measurements: 4.1 x 2.0 x 2.1 cm. No mass. Mild testicular microlithiasis. Right epididymis:  Normal in size and appearance. Left epididymis:  Normal in size and appearance.  Hydrocele:  None visualized. Varicocele:  Small left-sided varicocele. Pulsed Doppler interrogation of both testes demonstrates normal low resistance arterial and venous waveforms bilaterally. IMPRESSION: 1. Normal size testicles with symmetric vascular flow and no evidence of torsion. 2. Mild left testicular microlithiasis. Current literature suggests that testicular microlithiasis is not a significant independent risk factor for development of testicular carcinoma, and that follow up imaging is not warranted in the absence of other risk factors. Monthly testicular self-examination and annual physical exams are considered appropriate surveillance. If patient has other risk factors for testicular carcinoma, then referral to Urology should be considered. (Reference: DeCastro, et al.: A 5-Year Follow up Study of Asymptomatic Men with Testicular Microlithiasis. J Urol 2008; 179:1420-1423.). 3.  Small left-sided varicocele. Electronically Signed   By: Elberta Fortis M.D.   On: 11/01/2019 06:14    Procedures Procedures (including critical care time)  Medications Ordered in ED Medications  pantoprazole (PROTONIX) EC tablet 40 mg (has no administration in time range)  sodium chloride 0.9 % bolus 500 mL (0 mLs Intravenous Stopped 11/01/19 0658)  morphine 4 MG/ML injection 4 mg (4 mg Intravenous Given 11/01/19 0501)  ondansetron (ZOFRAN) injection 4 mg (4 mg Intravenous Given 11/01/19 0501)  alum & mag hydroxide-simeth (MAALOX/MYLANTA) 200-200-20 MG/5ML suspension 30 mL (30 mLs Oral Given 11/01/19 4315)     Initial Impression / Assessment and Plan / ED Course  I have reviewed the triage vital signs and the nursing notes.  Pertinent labs & imaging results that were available during my care of the patient were reviewed by me and considered in my medical decision making (see chart for details).  Clinical Course as of Oct 31 718  Wynelle Link Nov 01, 2019  0500 Elevation in AST and ALT.  Patient continues to have  tenderness to palpation in the epigastric region.  Will obtain ultrasound to rule out cholecystitis.  AST(!): 124 [HM]  0716 Suspect dehydration as opposed to urinary tract infection given no UTI symptoms and no penile discharge.  Glori LuisMarland Kitchen): MODERATE [HM]  0717 No evidence of pancreatitis  Lipase: 27 [HM]    Clinical Course User Index [HM] Miroslav Gin, Dahlia Client, PA-C          Patient presents with abdominal pain after large-volume alcohol  intake.  Patient with tenderness in the epigastrium and left upper quadrant.  Patient also with some lower abdominal tenderness.  Testicular exam reveals tender left testicle without cremaster reflex.  Scrotal ultrasound shows no evidence of testicular torsion.  On repeat exam, patient continues to be tender in the epigastrium.  Will obtain right upper quadrant ultrasound to rule out cholecystitis however suspect patient's pain is likely secondary to alcoholic gastritis.  Protonix and GI cocktail given here in the emergency department.  7:19 AM At shift change care was transferred to Dr. Gustavus Messing who will follow pending studies, re-evaulate and determine disposition.    Final Clinical Impressions(s) / ED Diagnoses   Final diagnoses:  Epigastric abdominal pain    ED Discharge Orders    None       Loni Muse Gwenlyn Perking 11/01/19 0720    Fatima Blank, MD 11/01/19 [phone]

## 2019-11-01 NOTE — ED Provider Notes (Signed)
7:12 AM Care assumed from [facility] while awaiting results of right upper quadrant ultrasound to investigate epigastric abdominal pain with elevated liver function.  Previous team suspects alcoholic gastritis as etiology of symptoms and plan of care will be discharge patient with likely Prilosec if the ultrasound is reassuring.  If ultrasound shows gallbladder etiology symptoms, will likely contact surgery.  They did not feel patient had UTI based on lack of urinary symptoms.  8:18 AM Right upper quadrant ultrasound returned reassuring with no significant or maladies.  Other labs consistent with alcoholic gastritis.  Patient's AST and ALT are elevated and an alcohol-like pattern.  Patient agrees with prescription for Prilosec and nausea medication and outpatient follow-up.  He understands return precautions and was feeling better.  Patient and family no other questions or concerns and patient was discharged in good condition.   Clinical Impression: 1. Epigastric abdominal pain   2. Varicocele   3. Testicular microlithiasis     Disposition: Discharge  Condition: Good  I have discussed the results, Dx and Tx plan with the pt(& family if present). He/she/they expressed understanding and agree(s) with the plan. Discharge instructions discussed at great length. Strict return precautions discussed and pt &/or family have verbalized understanding of the instructions. No further questions at time of discharge.    New Prescriptions   OMEPRAZOLE (PRILOSEC) 20 MG CAPSULE    Take 1 capsule (20 mg total) by mouth daily.   ONDANSETRON (ZOFRAN) 4 MG TABLET    Take 1 tablet (4 mg total) by mouth every 8 (eight) hours as needed.    Follow Up: [place] [phone] Schedule an appointment as soon as possible for a visit    [place] [address] [id number] mc [place]           Tegeler, Gwenyth Allegra, MD 11/01/19 [phone]

## 2019-11-01 NOTE — ED Notes (Signed)
ED Provider at bedside. 

## 2019-11-01 NOTE — ED Notes (Signed)
Patient has been eating cheetos in the lobby, was advised that he should not eat/drink until seen by the provider.

## 2019-11-01 NOTE — ED Notes (Signed)
Patient transported to Ultrasound 

## 2019-11-01 NOTE — Discharge Instructions (Addendum)
Your work-up today was overall reassuring and we suspect your symptoms related to alcohol induced gastritis.  Please use the nausea medicine and acid medicine to help with the symptoms.  Please try to avoid alcohol as best you can.  The ultrasound of your scrotum also showed a small varicocele and testicular stones, please follow-up with PCP and urology for further monitoring of this.  If any symptoms change or worsen, return to nearest emergency part.

## 2019-11-01 NOTE — ED Triage Notes (Signed)
Pt c/o abd pain that began yesterday morning. Denies N/V.

## 2019-11-23 ENCOUNTER — Other Ambulatory Visit: Payer: Self-pay

## 2019-11-23 DIAGNOSIS — Z20822 Contact with and (suspected) exposure to covid-19: Secondary | ICD-10-CM

## 2019-11-24 LAB — NOVEL CORONAVIRUS, NAA: SARS-CoV-2, NAA: NOT DETECTED

## 2021-02-11 ENCOUNTER — Emergency Department (HOSPITAL_COMMUNITY)
Admission: EM | Admit: 2021-02-11 | Discharge: 2021-02-11 | Disposition: A | Discharge status: Home / Self Care | Payer: No Typology Code available for payment source | Attending: Emergency Medicine | Admitting: Emergency Medicine

## 2021-02-11 ENCOUNTER — Encounter (HOSPITAL_COMMUNITY): Payer: Self-pay

## 2021-02-11 ENCOUNTER — Emergency Department (HOSPITAL_COMMUNITY): Payer: No Typology Code available for payment source

## 2021-02-11 ENCOUNTER — Other Ambulatory Visit: Payer: Self-pay

## 2021-02-11 DIAGNOSIS — Y92410 Unspecified street and highway as the place of occurrence of the external cause: Secondary | ICD-10-CM | POA: Diagnosis not present

## 2021-02-11 DIAGNOSIS — F17200 Nicotine dependence, unspecified, uncomplicated: Secondary | ICD-10-CM | POA: Insufficient documentation

## 2021-02-11 DIAGNOSIS — M545 Low back pain, unspecified: Secondary | ICD-10-CM | POA: Diagnosis not present

## 2021-02-11 DIAGNOSIS — S161XXA Strain of muscle, fascia and tendon at neck level, initial encounter: Secondary | ICD-10-CM | POA: Insufficient documentation

## 2021-02-11 DIAGNOSIS — S199XXA Unspecified injury of neck, initial encounter: Secondary | ICD-10-CM | POA: Diagnosis present

## 2021-02-11 DIAGNOSIS — R0789 Other chest pain: Secondary | ICD-10-CM | POA: Diagnosis not present

## 2021-02-11 DIAGNOSIS — S80812A Abrasion, left lower leg, initial encounter: Secondary | ICD-10-CM | POA: Diagnosis not present

## 2021-02-11 MED ORDER — LIDOCAINE 5 % EX PTCH
1.0000 | MEDICATED_PATCH | CUTANEOUS | Status: DC
  Administered 2021-02-11: 1 via TRANSDERMAL
  Filled 2021-02-11: qty 1

## 2021-02-11 MED ORDER — IBUPROFEN 200 MG PO TABS
600.0000 mg | ORAL_TABLET | Freq: Once | ORAL | Status: AC
  Administered 2021-02-11: 600 mg via ORAL
  Filled 2021-02-11: qty 3

## 2021-02-11 MED ORDER — METHOCARBAMOL 500 MG PO TABS: 500.0000 mg | ORAL_TABLET | Freq: Two times a day (BID) | ORAL | 0 refills | Status: AC

## 2021-02-11 MED ORDER — LIDOCAINE 5 % EX PTCH: 1.0000 | MEDICATED_PATCH | CUTANEOUS | 0 refills | Status: AC
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
## 2021-02-13 ENCOUNTER — Emergency Department (HOSPITAL_COMMUNITY): Payer: No Typology Code available for payment source

## 2021-02-13 ENCOUNTER — Emergency Department (HOSPITAL_COMMUNITY)
Admission: EM | Admit: 2021-02-13 | Discharge: 2021-02-13 | Disposition: A | Discharge status: Home / Self Care | Payer: No Typology Code available for payment source | Attending: Emergency Medicine | Admitting: Emergency Medicine

## 2021-02-13 ENCOUNTER — Other Ambulatory Visit: Payer: Self-pay

## 2021-02-13 ENCOUNTER — Encounter (HOSPITAL_COMMUNITY): Payer: Self-pay

## 2021-02-13 DIAGNOSIS — R52 Pain, unspecified: Secondary | ICD-10-CM

## 2021-02-13 DIAGNOSIS — F17200 Nicotine dependence, unspecified, uncomplicated: Secondary | ICD-10-CM | POA: Insufficient documentation

## 2021-02-13 DIAGNOSIS — M79605 Pain in left leg: Secondary | ICD-10-CM

## 2021-02-13 DIAGNOSIS — S80812A Abrasion, left lower leg, initial encounter: Secondary | ICD-10-CM | POA: Insufficient documentation

## 2021-02-13 DIAGNOSIS — Y92410 Unspecified street and highway as the place of occurrence of the external cause: Secondary | ICD-10-CM | POA: Insufficient documentation

## 2021-02-13 DIAGNOSIS — S8992XA Unspecified injury of left lower leg, initial encounter: Secondary | ICD-10-CM | POA: Diagnosis present

## 2021-02-13 LAB — D-DIMER, QUANTITATIVE: D-Dimer, Quant: 0.61 ug/mL-FEU — ABNORMAL HIGH (ref 0.00–0.50)

## 2021-02-13 MED ORDER — ENOXAPARIN SODIUM 60 MG/0.6ML ~~LOC~~ SOLN
1.0000 mg/kg | Freq: Once | SUBCUTANEOUS | Status: AC
  Administered 2021-02-13: 60 mg via SUBCUTANEOUS
  Filled 2021-02-13: qty 0.6
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
## 2021-02-13 NOTE — Discharge Instructions (Addendum)
You came to the emergency room today seeking evaluated for your left leg pain and swelling.  Your x-ray showed no broken bones or dislocations.  Due to your leg swelling and pain I was concerned for a blood clot in your leg.  The lab work we did could not rule out DVT as a possible cause of your symptoms.  Due to this you were given a blood thinner and must return to the hospital tomorrow to have an ultrasound of your left lower leg.    Please go to [facility] at 11 am and tell them you are there for a vascular study.  Get help right away if: You have signs or symptoms that a blood clot has moved to the lungs. These may include: Shortness of breath. Chest pain. Fast or irregular heartbeats (palpitations). Light-headedness or dizziness. Coughing up blood. You have signs or symptoms that your blood is too thin. These may include: Blood in your vomit, stool, or urine. A cut that will not stop bleeding. A menstrual period that is heavier than usual. A severe headache or confusion.

## 2021-02-13 NOTE — ED Notes (Signed)
An After Visit Summary was printed and given to the patient. Discharge instructions given and no further questions at this time.  Pt states he understands that he has an appointment tomorrow for a vascular study.

## 2021-02-14 ENCOUNTER — Ambulatory Visit (HOSPITAL_COMMUNITY)
Admission: RE | Admit: 2021-02-14 | Discharge: 2021-02-14 | Disposition: A | Discharge status: Home / Self Care | Payer: Self-pay | Source: Ambulatory Visit | Attending: Emergency Medicine | Admitting: Emergency Medicine

## 2021-02-14 ENCOUNTER — Ambulatory Visit (HOSPITAL_COMMUNITY): Payer: Self-pay | Attending: Emergency Medicine

## 2021-02-14 ENCOUNTER — Other Ambulatory Visit: Payer: Self-pay

## 2021-02-14 DIAGNOSIS — M7989 Other specified soft tissue disorders: Secondary | ICD-10-CM

## 2021-02-14 DIAGNOSIS — R600 Localized edema: Secondary | ICD-10-CM | POA: Insufficient documentation

## 2021-02-14 DIAGNOSIS — R7989 Other specified abnormal findings of blood chemistry: Secondary | ICD-10-CM

## 2021-02-14 DIAGNOSIS — M79606 Pain in leg, unspecified: Secondary | ICD-10-CM | POA: Insufficient documentation

## 2021-02-14 DIAGNOSIS — M79605 Pain in left leg: Secondary | ICD-10-CM

## 2021-02-14 DIAGNOSIS — R791 Abnormal coagulation profile: Secondary | ICD-10-CM | POA: Insufficient documentation

## 2021-02-14 NOTE — Progress Notes (Signed)
Lower extremity venous has been completed.   Preliminary results in CV Proc.   Blanch Media 02/14/2021 3:57 PM

## 2021-04-16 IMAGING — US US ABDOMEN LIMITED
1 series · 14 of 25 positions shown · non-contrast
Comparison: None.

CLINICAL DATA: Elevated LFTs with abdominal pain 1 day.

EXAM:
ULTRASOUND ABDOMEN LIMITED RIGHT UPPER QUADRANT

[Series 1: us abdomen limited · 14 of 60 slices shown]
[im 1/60]
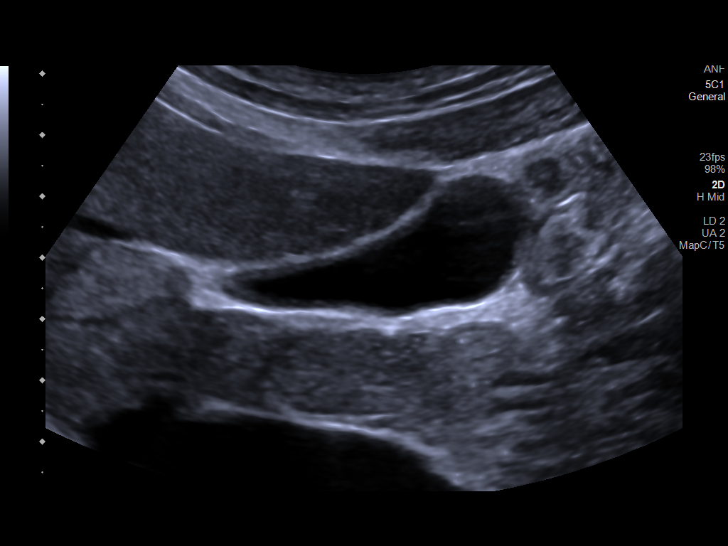
[im 5/60]
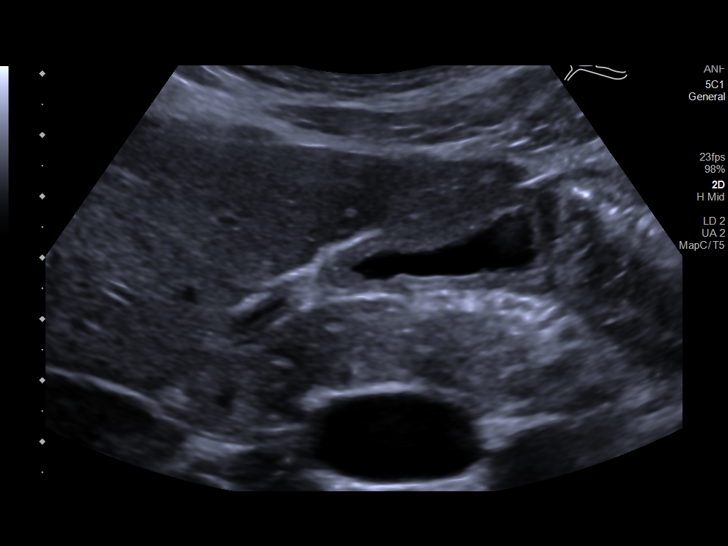
[im 10/60]
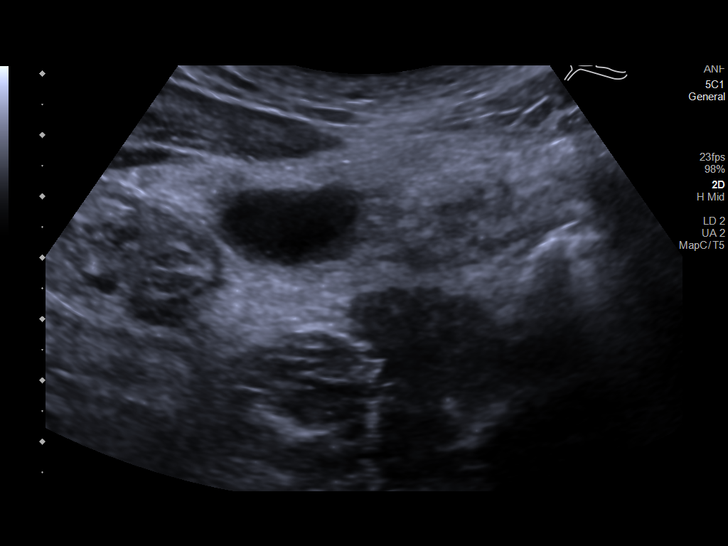
[im 15/60]
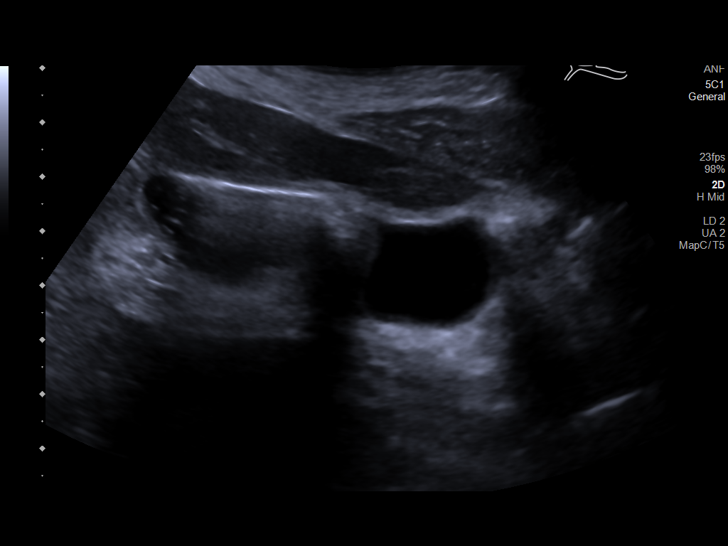
[im 20/60]
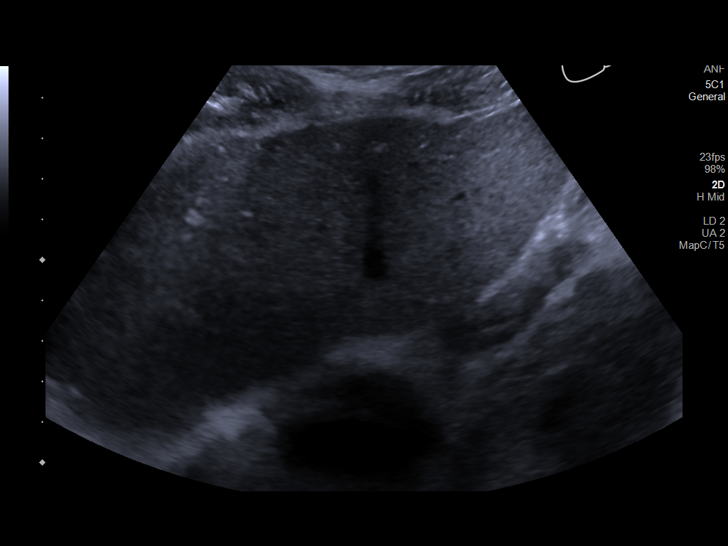
[im 23/60]
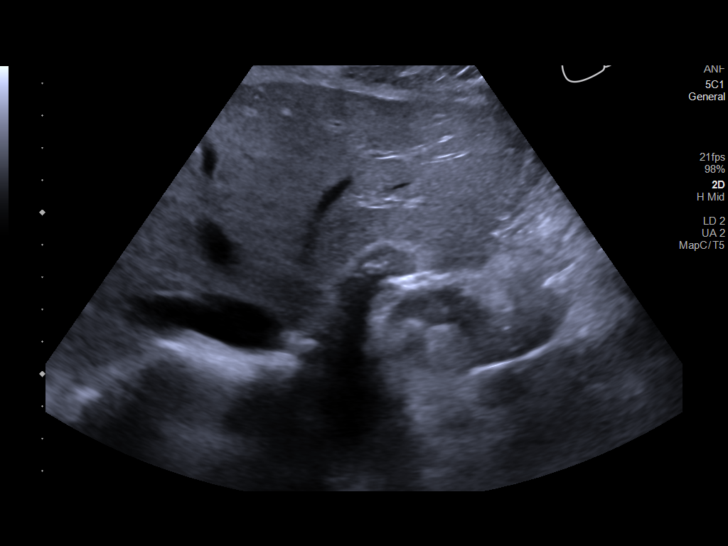
[im 28/60]
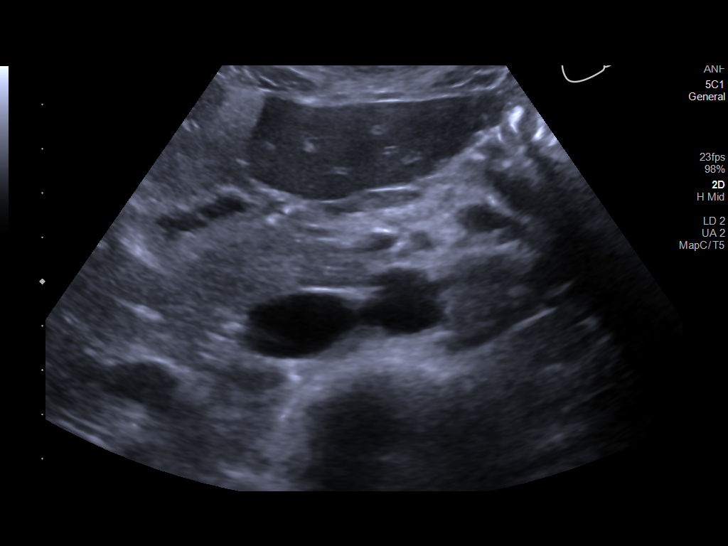
[im 32/60]
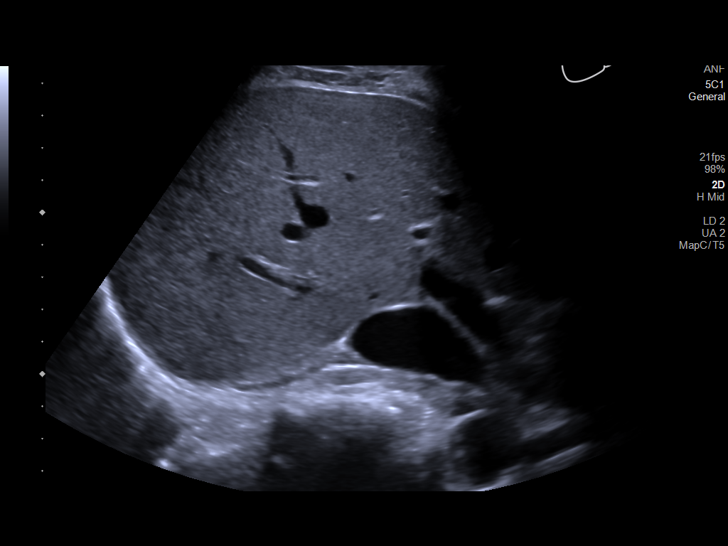
[im 37/60]
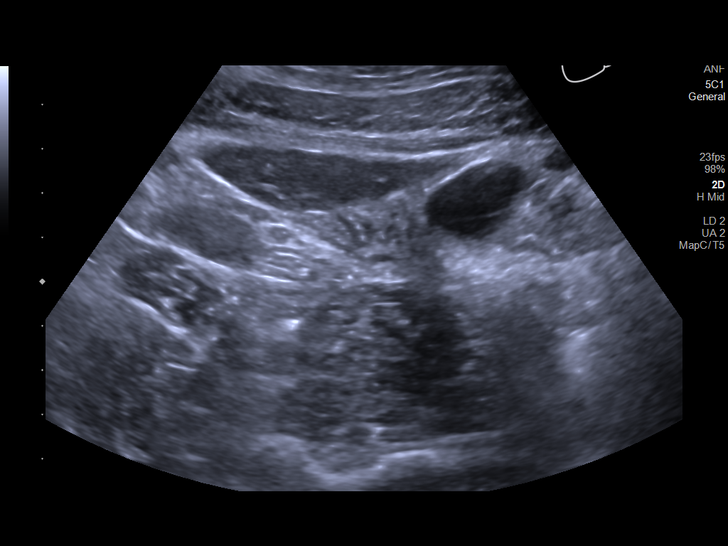
[im 40/60]
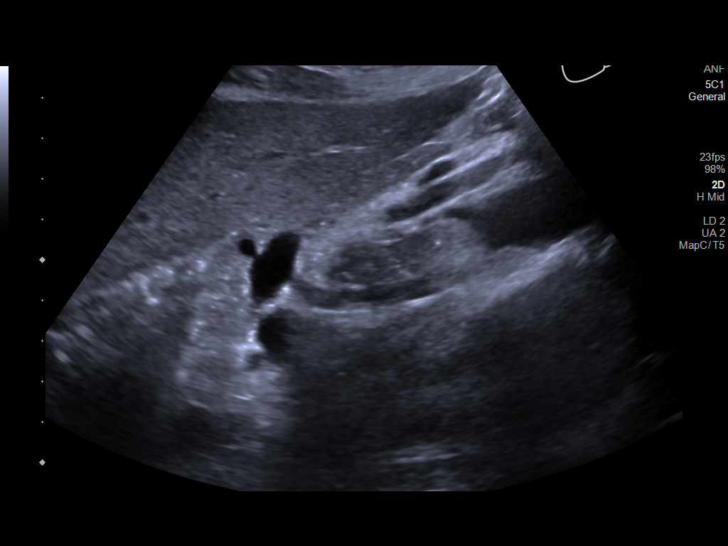
[im 45/60]
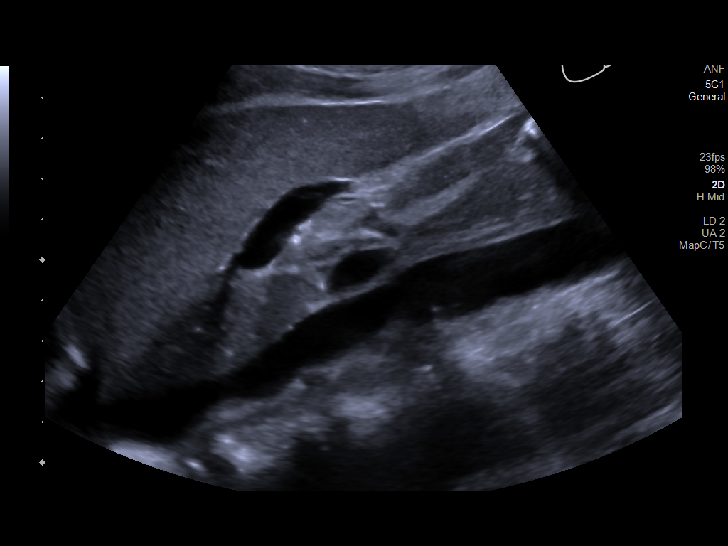
[im 50/60]
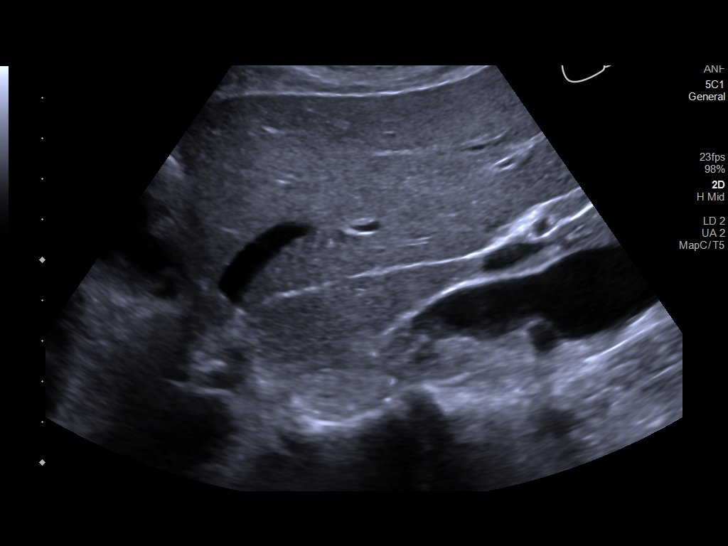
[im 55/60]
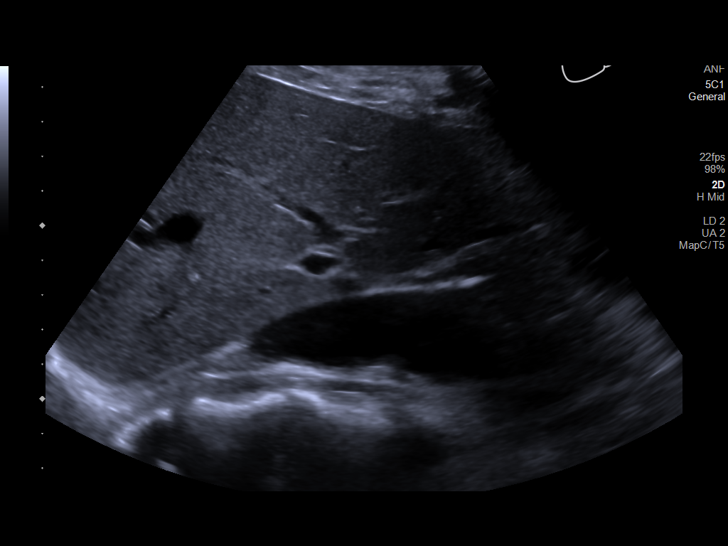
[im 60/60]
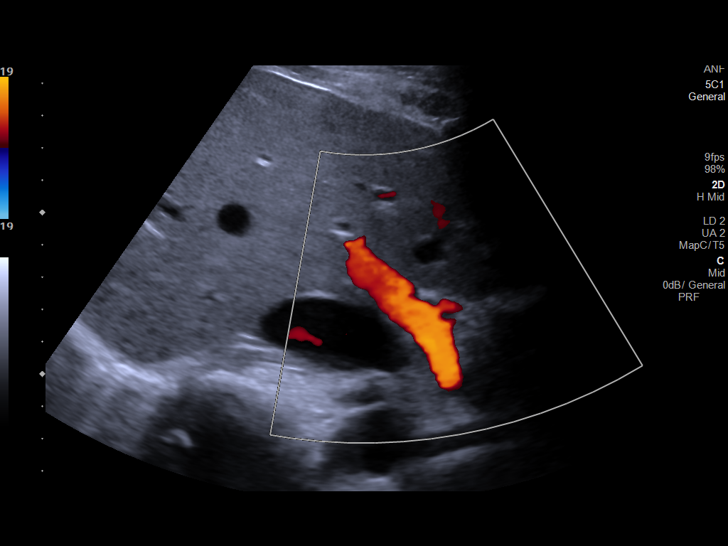

[14 of 25 positions shown; findings below may reference images not displayed]

FINDINGS: Gallbladder:

No gallstones or wall thickening visualized. No sonographic Murphy
sign noted by sonographer.

Common bile duct:

Diameter: 2.4 mm.

Liver:

No focal lesion identified. Within normal limits in parenchymal
echogenicity. Portal vein is patent on color Doppler imaging with
normal direction of blood flow towards the liver.

Other: None.
IMPRESSION: Normal right upper quadrant ultrasound.

## 2021-06-08 ENCOUNTER — Other Ambulatory Visit: Payer: Self-pay

## 2021-06-08 ENCOUNTER — Emergency Department (HOSPITAL_COMMUNITY)
Admission: EM | Admit: 2021-06-08 | Discharge: 2021-06-08 | Disposition: A | Discharge status: Home / Self Care | Payer: Self-pay | Attending: Emergency Medicine | Admitting: Emergency Medicine

## 2021-06-08 ENCOUNTER — Encounter (HOSPITAL_COMMUNITY): Payer: Self-pay

## 2021-06-08 DIAGNOSIS — T7840XA Allergy, unspecified, initial encounter: Secondary | ICD-10-CM | POA: Insufficient documentation

## 2021-06-08 DIAGNOSIS — R000 Tachycardia, unspecified: Secondary | ICD-10-CM | POA: Insufficient documentation

## 2021-06-08 DIAGNOSIS — F17200 Nicotine dependence, unspecified, uncomplicated: Secondary | ICD-10-CM | POA: Insufficient documentation

## 2021-06-08 MED ORDER — LACTATED RINGERS IV BOLUS
1000.0000 mL | Freq: Once | INTRAVENOUS | Status: AC
  Administered 2021-06-08: 15:00:00 1000 mL via INTRAVENOUS

## 2021-06-08 MED ORDER — DIPHENHYDRAMINE HCL 50 MG/ML IJ SOLN
50.0000 mg | Freq: Once | INTRAMUSCULAR | Status: AC
  Administered 2021-06-08: 15:00:00 50 mg via INTRAVENOUS
  Filled 2021-06-08: qty 1

## 2021-06-08 MED ORDER — LACTATED RINGERS IV SOLN: INTRAVENOUS | Status: DC

## 2021-06-08 MED ORDER — FAMOTIDINE IN NACL 20-0.9 MG/50ML-% IV SOLN
20.0000 mg | Freq: Once | INTRAVENOUS | Status: AC
  Administered 2021-06-08: 15:00:00 20 mg via INTRAVENOUS
  Filled 2021-06-08: qty 50

## 2021-06-08 MED ORDER — METHYLPREDNISOLONE SODIUM SUCC 125 MG IJ SOLR
125.0000 mg | Freq: Once | INTRAMUSCULAR | Status: AC
  Administered 2021-06-08: 15:00:00 125 mg via INTRAVENOUS
  Filled 2021-06-08: qty 2

## 2021-06-08 MED ORDER — EPINEPHRINE 0.3 MG/0.3ML IJ SOAJ: 0.3000 mg | Freq: Once | INTRAMUSCULAR | 2 refills | Status: AC

## 2021-06-08 NOTE — ED Triage Notes (Signed)
Pt reports being stung by a bee about 30-40 minutes ago. Patient now has hives to arms and legs. He reports developing a cough on the way here. 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

## 2021-06-08 NOTE — ED Provider Notes (Signed)
[facility]-EMERGENCY DEPT Provider Note   CSN: [id number] Arrival date & time: 06/08/21  1448     History Chief Complaint  Patient presents with   Allergic Reaction    Luke Mullins is a 50 y.o. male.  50 year old male who presents with allergic reaction after being stung by bees just prior to arrival.  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  He denies taking medications prior to arrival.  Notes some pruritus but no rash.  No trouble swallowing.  Does feel anxious at this time.      Past Medical History:  Diagnosis Date   Sarcoidosis     There are no problems to display for this patient.   History reviewed. No pertinent surgical history.     History reviewed. No pertinent family history.  Social History   Tobacco Use   Smoking status: Every Day    Pack years: 0.00   Smokeless tobacco: Never  Substance Use Topics   Alcohol use: Yes   Drug use: Yes    Types: Marijuana    Home Medications Prior to Admission medications   Medication Sig Start Date End Date Taking? Authorizing Provider  acetaminophen (TYLENOL) 325 MG tablet Take 2 tablets (650 mg total) by mouth every 6 (six) hours as needed. Do not take more than 4000mg  of tylenol per day 04/30/18   Couture, Cortni S, PA-C  EPINEPHrine (EPIPEN) 0.3 mg/0.3 mL SOAJ injection Inject 0.3 mLs (0.3 mg total) into the muscle as needed. 09/15/13   09/17/13, MD  ibuprofen (ADVIL,MOTRIN) 400 MG tablet Take 1 tablet (400 mg total) by mouth every 6 (six) hours as needed. 04/30/18   Couture, Cortni S, PA-C  lidocaine (LIDODERM) 5 % Place 1 patch onto the skin daily. Remove & Discard patch within 12 hours or as directed by clinician 02/11/21   McDonald, Mia A, PA-C  methocarbamol (ROBAXIN) 500 MG tablet Take 1 tablet (500 mg total) by mouth 2 (two) times daily. 02/11/21   McDonald, Mia A, PA-C  naproxen (NAPROSYN) 500 MG tablet Take 1 tablet (500 mg total) by mouth 2 (two) times daily. 02/18/17   Horton, 02/20/17,  MD  omeprazole (PRILOSEC) 20 MG capsule Take 1 capsule (20 mg total) by mouth daily. 11/01/19   Tegeler, 13/8/20, MD  ondansetron (ZOFRAN) 4 MG tablet Take 1 tablet (4 mg total) by mouth every 8 (eight) hours as needed. 11/01/19   Tegeler, 13/8/20, MD    Allergies    Bee venom  Review of Systems   Review of Systems  All other systems reviewed and are negative.  Physical Exam Updated Vital Signs BP (!) 114/93 (BP Location: Right Arm)   Pulse (!) 136   Temp 98.3 F (36.8 C) (Oral)   Resp 18   SpO2 95%   Physical Exam Vitals and nursing note reviewed.  Constitutional:      General: He is not in acute distress.    Appearance: Normal appearance. He is well-developed. He is not toxic-appearing.  HENT:     Head: Normocephalic and atraumatic.  Eyes:     General: Lids are normal.     Conjunctiva/sclera: Conjunctivae normal.     Pupils: Pupils are equal, round, and reactive to light.  Neck:     Thyroid: No thyroid mass.     Trachea: No tracheal deviation.  Cardiovascular:     Rate and Rhythm: Regular rhythm. Tachycardia present.     Heart sounds: Normal heart sounds. No murmur  heard.   No gallop.  Pulmonary:     Effort: Pulmonary effort is normal. No respiratory distress.     Breath sounds: Normal breath sounds. No stridor. No decreased breath sounds, wheezing, rhonchi or rales.  Abdominal:     General: There is no distension.     Palpations: Abdomen is soft.     Tenderness: There is no abdominal tenderness. There is no rebound.  Musculoskeletal:        General: No tenderness. Normal range of motion.     Cervical back: Normal range of motion and neck supple.  Skin:    General: Skin is warm and dry.     Findings: No abrasion or rash.  Neurological:     Mental Status: He is alert and oriented to person, place, and time. Mental status is at baseline.     GCS: GCS eye subscore is 4. GCS verbal subscore is 5. GCS motor subscore is 6.     Cranial Nerves: Cranial  nerves are intact. No cranial nerve deficit.     Sensory: No sensory deficit.     Motor: Motor function is intact.  Psychiatric:        Attention and Perception: Attention normal.        Speech: Speech normal.        Behavior: Behavior normal.    ED Results / Procedures / Treatments   Labs (all labs ordered are listed, but only abnormal results are displayed) Labs Reviewed - No data to display  EKG None  Radiology No results found.  Procedures Procedures   Medications Ordered in ED Medications  lactated ringers bolus 1,000 mL (has no administration in time range)  lactated ringers infusion (has no administration in time range)  diphenhydrAMINE (BENADRYL) injection 50 mg (has no administration in time range)  famotidine (PEPCID) IVPB 20 mg premix (has no administration in time range)  methylPREDNISolone sodium succinate (SOLU-MEDROL) 125 mg/2 mL injection 125 mg (has no administration in time range)    ED Course  I have reviewed the triage vital signs and the nursing notes.  Pertinent labs & imaging results that were available during my care of the patient were reviewed by me and considered in my medical decision making (see chart for details).    MDM Rules/Calculators/A&P                          Patient medicated for allergic reaction here with Cymetra, Benadryl, Pepcid and feels better.  Will give EpiPen and discharge CRITICAL CARE Performed by: Toy Baker Total critical care time: 60 minutes Critical care time was exclusive of separately billable procedures and treating other patients. Critical care was necessary to treat or prevent imminent or life-threatening deterioration. Critical care was time spent personally by me on the following activities: development of treatment plan with patient and/or surrogate as well as nursing, discussions with consultants, evaluation of patient's response to treatment, examination of patient, obtaining history from patient or  surrogate, ordering and performing treatments and interventions, ordering and review of laboratory studies, ordering and review of radiographic studies, pulse oximetry and re-evaluation of patient's condition.  Final Clinical Impression(s) / ED Diagnoses Final diagnoses:  None    Rx / DC Orders ED Discharge Orders     None        Lorre Nick, MD 06/08/21 1724

## 2022-02-03 ENCOUNTER — Emergency Department (HOSPITAL_COMMUNITY)
Admission: EM | Admit: 2022-02-03 | Discharge: 2022-02-03 | Disposition: A | Discharge status: Home / Self Care | Payer: No Typology Code available for payment source | Attending: Emergency Medicine | Admitting: Emergency Medicine

## 2022-02-03 ENCOUNTER — Other Ambulatory Visit: Payer: Self-pay

## 2022-02-03 ENCOUNTER — Encounter (HOSPITAL_COMMUNITY): Payer: Self-pay | Admitting: Emergency Medicine

## 2022-02-03 ENCOUNTER — Emergency Department (HOSPITAL_COMMUNITY): Payer: No Typology Code available for payment source

## 2022-02-03 DIAGNOSIS — S01512A Laceration without foreign body of oral cavity, initial encounter: Secondary | ICD-10-CM | POA: Insufficient documentation

## 2022-02-03 DIAGNOSIS — M542 Cervicalgia: Secondary | ICD-10-CM | POA: Insufficient documentation

## 2022-02-03 DIAGNOSIS — S0993XA Unspecified injury of face, initial encounter: Secondary | ICD-10-CM | POA: Diagnosis present

## 2022-02-03 DIAGNOSIS — Y92410 Unspecified street and highway as the place of occurrence of the external cause: Secondary | ICD-10-CM | POA: Diagnosis not present

## 2022-02-03 DIAGNOSIS — M25552 Pain in left hip: Secondary | ICD-10-CM

## 2022-02-03 MED ORDER — IBUPROFEN 800 MG PO TABS
800.0000 mg | ORAL_TABLET | Freq: Once | ORAL | Status: AC
  Administered 2022-02-03: 800 mg via ORAL
  Filled 2022-02-03: qty 1

## 2022-02-03 NOTE — ED Notes (Signed)
Pt d/c home per MD order,. Discharge summary reviewed, pt verbalizes understanding. Ambulatory off unit with crutch assist. No s/s of acute distress noted at discharge.
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
## 2022-02-03 NOTE — Discharge Instructions (Addendum)
Please use crutches as needed for pain Recheck with orthopedist if continued hip pain after 2 weeks Use salt water swish and spit for care of your tongue laceration Return if you are having worsening symptoms especially increased head, neck pain, weakness on one side or the other.

## 2022-12-05 ENCOUNTER — Other Ambulatory Visit: Payer: Self-pay

## 2022-12-05 ENCOUNTER — Emergency Department (HOSPITAL_COMMUNITY)
Admission: EM | Admit: 2022-12-05 | Discharge: 2022-12-06 | Disposition: A | Discharge status: Home / Self Care | Payer: Medicaid Other | Attending: Emergency Medicine | Admitting: Emergency Medicine

## 2022-12-05 ENCOUNTER — Encounter (HOSPITAL_COMMUNITY): Payer: Self-pay | Admitting: *Deleted

## 2022-12-05 ENCOUNTER — Emergency Department (HOSPITAL_COMMUNITY): Payer: Medicaid Other

## 2022-12-05 DIAGNOSIS — J189 Pneumonia, unspecified organism: Secondary | ICD-10-CM

## 2022-12-05 DIAGNOSIS — R059 Cough, unspecified: Secondary | ICD-10-CM | POA: Diagnosis present

## 2022-12-05 DIAGNOSIS — J168 Pneumonia due to other specified infectious organisms: Secondary | ICD-10-CM | POA: Insufficient documentation

## 2022-12-05 LAB — CBC WITH DIFFERENTIAL/PLATELET
Abs Immature Granulocytes: 0.06 10*3/uL (ref 0.00–0.07)
Basophils Absolute: 0.1 10*3/uL (ref 0.0–0.1)
Basophils Relative: 1 %
Eosinophils Absolute: 0.2 10*3/uL (ref 0.0–0.5)
Eosinophils Relative: 2 %
HCT: 40.9 % (ref 39.0–52.0)
Hemoglobin: 13.8 g/dL (ref 13.0–17.0)
Immature Granulocytes: 1 %
Lymphocytes Relative: 12 %
Lymphs Abs: 1.6 10*3/uL (ref 0.7–4.0)
MCH: 33.5 pg (ref 26.0–34.0)
MCHC: 33.7 g/dL (ref 30.0–36.0)
MCV: 99.3 fL (ref 80.0–100.0)
Monocytes Absolute: 2.2 10*3/uL — ABNORMAL HIGH (ref 0.1–1.0)
Monocytes Relative: 17 %
Neutro Abs: 9.1 10*3/uL — ABNORMAL HIGH (ref 1.7–7.7)
Neutrophils Relative %: 67 %
Platelets: 577 10*3/uL — ABNORMAL HIGH (ref 150–400)
RBC: 4.12 MIL/uL — ABNORMAL LOW (ref 4.22–5.81)
RDW: 12.9 % (ref 11.5–15.5)
WBC: 13.3 10*3/uL — ABNORMAL HIGH (ref 4.0–10.5)
nRBC: 0 % (ref 0.0–0.2)

## 2022-12-05 LAB — COMPREHENSIVE METABOLIC PANEL
ALT: 11 U/L (ref 0–44)
AST: 22 U/L (ref 15–41)
Albumin: 2.8 g/dL — ABNORMAL LOW (ref 3.5–5.0)
Alkaline Phosphatase: 63 U/L (ref 38–126)
Anion gap: 10 (ref 5–15)
BUN: 5 mg/dL — ABNORMAL LOW (ref 6–20)
CO2: 31 mmol/L (ref 22–32)
Calcium: 8.8 mg/dL — ABNORMAL LOW (ref 8.9–10.3)
Chloride: 98 mmol/L (ref 98–111)
Creatinine, Ser: 0.9 mg/dL (ref 0.61–1.24)
GFR, Estimated: >60 mL/min (ref >60)
Glucose, Bld: 132 mg/dL — ABNORMAL HIGH (ref 70–99)
Potassium: 3.7 mmol/L (ref 3.5–5.1)
Sodium: 139 mmol/L (ref 135–145)
Total Bilirubin: <0.1 mg/dL — ABNORMAL LOW (ref 0.3–1.2)
Total Protein: 7.1 g/dL (ref 6.5–8.1)

## 2022-12-05 LAB — TROPONIN I (HIGH SENSITIVITY): Troponin I (High Sensitivity): 11 ng/L (ref <18)

## 2022-12-05 NOTE — ED Triage Notes (Signed)
The pt is c/o a cold  cough headache generalized body aches for 2 weeks  he may have had a temp in the past  weeks he has a historey od pneumonia he has pain in his rt and lt chest

## 2022-12-05 NOTE — ED Provider Triage Note (Signed)
Emergency Medicine Provider Triage Evaluation Note  Luke Mullins , a 51 y.o. male  was evaluated in triage.  Pt complains of URI symptoms with generalized body aches for the past 2 weeks.  He reports the cough is still lingering and he becomes short of breath when he climbs stairs.  He has history of pneumonia.  Reports discomfort to the left side of his chest.  Denies fever, chills, nausea, vomiting, diarrhea.   Review of Systems  Positive: See above Negative: See above  Physical Exam  BP (!) 146/101   Pulse (!) 103   Temp 99.6 F (37.6 C)   Resp 18   Ht 5\' 6"  (1.676 m)   Wt 59 kg   SpO2 98%   BMI 20.99 kg/m  Gen:   Awake, no distress   Resp:  Normal effort, wheezing appreciated to left upper lobe MSK:   Moves extremities without difficulty  Other:    Medical Decision Making  Medically screening exam initiated at 8:36 PM.  Appropriate orders placed.  Luke Mullins was informed that the remainder of the evaluation will be completed by another provider, this initial triage assessment does not replace that evaluation, and the importance of remaining in the ED until their evaluation is complete.     Katrinka Blazing, Lenard Simmer 12/05/22 2038

## 2022-12-06 LAB — TROPONIN I (HIGH SENSITIVITY): Troponin I (High Sensitivity): 13 ng/L (ref <18)

## 2022-12-06 MED ORDER — PROMETHAZINE-DM 6.25-15 MG/5ML PO SYRP: 5.0000 mL | ORAL_SOLUTION | Freq: Four times a day (QID) | ORAL | 0 refills | Status: AC | PRN

## 2022-12-06 MED ORDER — ALBUTEROL SULFATE HFA 108 (90 BASE) MCG/ACT IN AERS
2.0000 | INHALATION_SPRAY | Freq: Once | RESPIRATORY_TRACT | Status: AC
  Administered 2022-12-06: 2 via RESPIRATORY_TRACT
  Filled 2022-12-06: qty 6.7

## 2022-12-06 MED ORDER — AMOXICILLIN-POT CLAVULANATE 875-125 MG PO TABS
1.0000 | ORAL_TABLET | Freq: Once | ORAL | Status: AC
Start: 2022-12-06 — End: 2022-12-06
  Administered 2022-12-06: 1 via ORAL
  Filled 2022-12-06: qty 1

## 2022-12-06 MED ORDER — AMOXICILLIN-POT CLAVULANATE 875-125 MG PO TABS: 1.0000 | ORAL_TABLET | Freq: Two times a day (BID) | ORAL | 0 refills | Status: AC

## 2022-12-06 MED ORDER — ACETAMINOPHEN 500 MG PO TABS
1000.0000 mg | ORAL_TABLET | ORAL | Status: AC
  Administered 2022-12-06: 1000 mg via ORAL
  Filled 2022-12-06: qty 2

## 2022-12-06 MED ORDER — ONDANSETRON 4 MG PO TBDP: 4.0000 mg | ORAL_TABLET | Freq: Three times a day (TID) | ORAL | 0 refills | Status: AC | PRN

## 2022-12-06 NOTE — ED Provider Notes (Signed)
[facility] EMERGENCY DEPARTMENT Provider Note   CSN: [id number] Arrival date & time: 12/05/22  1853     History  Chief Complaint  Patient presents with   Cough    Luke Mullins is a 51 y.o. male.   Cough  Patient is a 51 year old gentleman with no pertinent past medical history apart from history of sarcoidosis  He is present emergency room today with cough for the past 2 weeks he said initially he was having some sinus congestion and fatigue but now he feels that he is primarily coughing and feeling tired and achy.  Denies any chest pain except for when he is coughing.  He is coughing up a yellow mucousy sputum.  No fevers lightheadedness or dizziness.  No recent surgeries, hospitalization, long travel, hemoptysis, estrogen containing OCP, cancer history.  No unilateral leg swelling.  No history of PE or VTE.     Home Medications Prior to Admission medications   Medication Sig Start Date End Date Taking? Authorizing Provider  acetaminophen (TYLENOL) 325 MG tablet Take 2 tablets (650 mg total) by mouth every 6 (six) hours as needed. Do not take more than 4000mg  of tylenol per day 04/30/18   Couture, Cortni S, PA-C  EPINEPHrine (EPIPEN) 0.3 mg/0.3 mL SOAJ injection Inject 0.3 mLs (0.3 mg total) into the muscle as needed. 09/15/13   Sharmon Leyden, MD  ibuprofen (ADVIL,MOTRIN) 400 MG tablet Take 1 tablet (400 mg total) by mouth every 6 (six) hours as needed. 04/30/18   Couture, Cortni S, PA-C  lidocaine (LIDODERM) 5 % Place 1 patch onto the skin daily. Remove & Discard patch within 12 hours or as directed by clinician 02/11/21   McDonald, Mia A, PA-C  methocarbamol (ROBAXIN) 500 MG tablet Take 1 tablet (500 mg total) by mouth 2 (two) times daily. 02/11/21   McDonald, Mia A, PA-C  naproxen (NAPROSYN) 500 MG tablet Take 1 tablet (500 mg total) by mouth 2 (two) times daily. 02/18/17   Horton, Barbette Hair, MD  omeprazole (PRILOSEC) 20 MG capsule Take 1 capsule (20 mg total)  by mouth daily. 11/01/19   Tegeler, Gwenyth Allegra, MD  ondansetron (ZOFRAN) 4 MG tablet Take 1 tablet (4 mg total) by mouth every 8 (eight) hours as needed. 11/01/19   Tegeler, Gwenyth Allegra, MD      Allergies    Bee venom    Review of Systems   Review of Systems  Respiratory:  Positive for cough.     Physical Exam Updated Vital Signs BP (!) 146/101   Pulse (!) 103   Temp 99.6 F (37.6 C)   Resp 18   Ht 5\' 6"  (1.676 m)   Wt 59 kg   SpO2 98%   BMI 20.99 kg/m  Physical Exam Vitals and nursing note reviewed.  Constitutional:      General: He is not in acute distress. HENT:     Head: Normocephalic and atraumatic.     Nose: Nose normal.  Eyes:     General: No scleral icterus. Cardiovascular:     Rate and Rhythm: Normal rate and regular rhythm.     Pulses: Normal pulses.     Heart sounds: Normal heart sounds.  Pulmonary:     Effort: Pulmonary effort is normal. No respiratory distress.     Breath sounds: Wheezing present.     Comments: Faint end expiratory wheeze Abdominal:     Palpations: Abdomen is soft.     Tenderness: There is no abdominal tenderness.  Musculoskeletal:     Cervical back: Normal range of motion.     Right lower leg: No edema.     Left lower leg: No edema.     Comments: No lower extremity edema or calf tenderness  Skin:    General: Skin is warm and dry.     Capillary Refill: Capillary refill takes less than 2 seconds.  Neurological:     Mental Status: He is alert. Mental status is at baseline.  Psychiatric:        Mood and Affect: Mood normal.        Behavior: Behavior normal.     ED Results / Procedures / Treatments   Labs (all labs ordered are listed, but only abnormal results are displayed) Labs Reviewed  CBC WITH DIFFERENTIAL/PLATELET - Abnormal; Notable for the following components:      Result Value   WBC 13.3 (*)    RBC 4.12 (*)    Platelets 577 (*)    Neutro Abs 9.1 (*)    Monocytes Absolute 2.2 (*)    All other components  within normal limits  COMPREHENSIVE METABOLIC PANEL - Abnormal; Notable for the following components:   Glucose, Bld 132 (*)    BUN 5 (*)    Calcium 8.8 (*)    Albumin 2.8 (*)    Total Bilirubin <0.1 (*)    All other components within normal limits  TROPONIN I (HIGH SENSITIVITY)  TROPONIN I (HIGH SENSITIVITY)    EKG None  Radiology DG Chest 2 View  Result Date: 12/05/2022 CLINICAL DATA:  Shortness of breath. Cough, headache, body aches. EXAM: CHEST - 2 VIEW COMPARISON:  02/11/2021 FINDINGS: Left upper lobe opacity abuts the fissure posteriorly. This is a slightly rounded appearance on frontal view stable heart size and mediastinal contours. Calcified mediastinal lymph nodes. No pulmonary edema, pleural effusion or pneumothorax. No acute osseous findings. IMPRESSION: Left upper lobe opacity abuts the fissure posteriorly, typical of pneumonia. This is a slightly rounded appearance on the frontal view. Followup PA and lateral chest X-ray is recommended in 3-4 weeks following trial of antibiotic therapy to ensure resolution and exclude underlying malignancy. Electronically Signed   By: Keith Rake M.D.   On: 12/05/2022 21:16    Procedures Procedures    Medications Ordered in ED Medications  acetaminophen (TYLENOL) tablet 1,000 mg (has no administration in time range)  amoxicillin-clavulanate (AUGMENTIN) 875-125 MG per tablet 1 tablet (has no administration in time range)  albuterol (VENTOLIN HFA) 108 (90 Base) MCG/ACT inhaler 2 puff (has no administration in time range)    ED Course/ Medical Decision Making/ A&P Clinical Course as of 12/06/22 0443  Thu Dec 06, 2022  0414 IMPRESSION: Left upper lobe opacity abuts the fissure posteriorly, typical of pneumonia. This is a slightly rounded appearance on the frontal view. Followup PA and lateral chest X-ray is recommended in 3-4 weeks following trial of antibiotic therapy to ensure resolution and exclude underlying malignancy.    [WF]    Clinical Course User Index [WF] Tedd Sias, PA                           Medical Decision Making Risk OTC drugs. Prescription drug management.   This patient presents to the ED for concern of cough, this involves a number of treatment options, and is a complaint that carries with it a moderate risk of complications and morbidity. A differential diagnosis was considered for the patient's symptoms  which is discussed below:   Differential diagnosis for emergent cause of cough includes but is not limited to upper respiratory infection, lower respiratory infection, allergies, asthma, irritants, foreign body, medications such as ACE inhibitors, reflux, asthma, CHF, lung cancer, interstitial lung disease, psychiatric causes, postnasal drip and postinfectious bronchospasm.    Co morbidities: Discussed in HPI   Brief History:  Patient is a 51 year old gentleman with no pertinent past medical history apart from history of sarcoidosis  He is present emergency room today with cough for the past 2 weeks he said initially he was having some sinus congestion and fatigue but now he feels that he is primarily coughing and feeling tired and achy.  Denies any chest pain except for when he is coughing.  He is coughing up a yellow mucousy sputum.  No fevers lightheadedness or dizziness.  No recent surgeries, hospitalization, long travel, hemoptysis, estrogen containing OCP, cancer history.  No unilateral leg swelling.  No history of PE or VTE.     EMR reviewed including pt PMHx, past surgical history and past visits to ER.   See HPI for more details   Lab Tests:   I ordered and independently interpreted labs. Labs notable for Leukocytosis with left shift, CMP with somewhat low albumin otherwise no significant abnormal findings, troponin 11, 13, no chest pain currently.  EKG nonischemic  Imaging Studies:  Abnormal findings. I personally reviewed all imaging studies. Imaging  notable for  IMPRESSION: Left upper lobe opacity abuts the fissure posteriorly, typical of pneumonia. This is a slightly rounded appearance on the frontal view. Followup PA and lateral chest X-ray is recommended in 3-4 weeks following trial of antibiotic therapy to ensure resolution and exclude underlying malignancy.     Cardiac Monitoring:  NA EKG non-ischemic LVH - reviewed by Dr. Adela Lank   Medicines ordered:  I ordered medication including albuterol Tylenol Augmentin for wheeze cough Reevaluation of the patient after these medicines showed that the patient improved I have reviewed the patients home medicines and have made adjustments as needed   Critical Interventions:     Consults/Attending Physician      Reevaluation:  After the interventions noted above I re-evaluated patient and found that they have :stayed the same   Social Determinants of Health:      Problem List / ED Course:  Pneumonia somewhat abnormal in appearance with left upper lobe circular shaped infiltrate.  Patient's history and physical exam is consistent with pneumonia and his labs showed leukocytosis with left shift.  Will treat with Augmentin and recommend close follow-up with PCP. Wheezing -no formal diagnosis of COPD patient is a daily smoker.  I recommend smoking cessation and provided patient with an albuterol inhaler as his current pneumonia is likely exacerbating his underlying COPD.   Dispostion:  After consideration of the diagnostic results and the patients response to treatment, I feel that the patent would benefit from discharge home with antibiotics and close follow-up with PCP.  Final Clinical Impression(s) / ED Diagnoses Final diagnoses:  Pneumonia of left upper lobe due to infectious organism    Rx / DC Orders ED Discharge Orders     None         Gailen Shelter, [place] 12/06/22 0443    Sabas Sous, MD 12/06/22 [phone]

## 2022-12-06 NOTE — ED Notes (Signed)
Patient verbalizes understanding of d/c instructions. Opportunities for questions and answers were provided. Pt d/c from ED and ambulated to lobby.  

## 2022-12-06 NOTE — Discharge Instructions (Signed)
Take the antibiotics for the entire course even if your symptoms improve.  As we discussed there is evidence of pneumonia on your chest x-ray given your symptoms I think it is reasonable to treat with antibiotics.  Hydrate, take Tylenol 1000 mg every 6 hours.  You will need to have a repeat chest x-ray done in the months since your chest x-ray here was somewhat abnormal looking.  As we discussed, your history of smoking puts you at higher risk for cancer and getting a chest x-ray after your pneumonia has been treated will be an important step in your preventative care  Please take all of your antibiotics until finished!   You may develop abdominal discomfort or diarrhea from the antibiotic.  You may help offset this with probiotics which you can buy or get in yogurt. Do not eat  or take the probiotics until 2 hours after your antibiotic.
